# Patient Record
Sex: Female | Born: 1985 | Race: Black or African American | Hispanic: No | Marital: Single | Smoking: Current every day smoker
Health system: Southern US, Community
[De-identification: ages and names within clinical notes are randomized; demographics above are authoritative.]

## PROBLEM LIST (undated history)

## (undated) HISTORY — PX: TONSILLECTOMY: SUR1361

## (undated) HISTORY — PX: TUBAL LIGATION: SHX77

---
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
## 2007-01-03 ENCOUNTER — Emergency Department: Payer: Self-pay | Admitting: Internal Medicine

## 2007-01-10 ENCOUNTER — Emergency Department: Payer: Self-pay | Admitting: Emergency Medicine

## 2007-01-21 ENCOUNTER — Emergency Department: Payer: Self-pay | Admitting: Emergency Medicine

## 2007-01-24 ENCOUNTER — Emergency Department: Payer: Self-pay | Admitting: Unknown Physician Specialty

## 2007-04-05 ENCOUNTER — Emergency Department: Payer: Self-pay | Admitting: Unknown Physician Specialty

## 2007-06-07 ENCOUNTER — Emergency Department: Payer: Self-pay | Admitting: Internal Medicine

## 2007-06-13 ENCOUNTER — Emergency Department: Payer: Self-pay | Admitting: Emergency Medicine

## 2007-07-08 ENCOUNTER — Other Ambulatory Visit: Payer: Self-pay

## 2007-07-08 ENCOUNTER — Emergency Department: Payer: Self-pay | Admitting: Unknown Physician Specialty

## 2007-08-04 ENCOUNTER — Emergency Department: Payer: Self-pay | Admitting: Emergency Medicine

## 2007-08-17 ENCOUNTER — Inpatient Hospital Stay: Payer: Self-pay | Admitting: Psychiatry

## 2007-08-17 ENCOUNTER — Other Ambulatory Visit: Payer: Self-pay

## 2007-09-26 ENCOUNTER — Emergency Department: Payer: Self-pay | Admitting: Internal Medicine

## 2007-10-07 ENCOUNTER — Emergency Department: Payer: Self-pay | Admitting: Emergency Medicine

## 2007-10-19 ENCOUNTER — Emergency Department: Payer: Self-pay | Admitting: Emergency Medicine

## 2007-11-18 ENCOUNTER — Emergency Department: Payer: Self-pay | Admitting: Emergency Medicine

## 2007-11-20 ENCOUNTER — Emergency Department: Payer: Self-pay | Admitting: Emergency Medicine

## 2008-05-10 ENCOUNTER — Emergency Department: Payer: Self-pay | Admitting: Emergency Medicine

## 2008-08-17 ENCOUNTER — Emergency Department: Payer: Self-pay | Admitting: Emergency Medicine

## 2008-09-20 ENCOUNTER — Emergency Department: Payer: Self-pay | Admitting: Emergency Medicine

## 2008-10-06 ENCOUNTER — Emergency Department: Payer: Self-pay | Admitting: Unknown Physician Specialty

## 2008-12-09 ENCOUNTER — Emergency Department: Payer: Self-pay | Admitting: Unknown Physician Specialty

## 2009-05-11 ENCOUNTER — Emergency Department: Payer: Self-pay | Admitting: Emergency Medicine

## 2009-06-18 ENCOUNTER — Emergency Department: Payer: Self-pay | Admitting: Emergency Medicine

## 2009-08-08 ENCOUNTER — Emergency Department: Payer: Self-pay | Admitting: Emergency Medicine

## 2010-07-21 ENCOUNTER — Emergency Department: Payer: Self-pay | Admitting: Emergency Medicine

## 2010-09-17 ENCOUNTER — Emergency Department: Payer: Self-pay | Admitting: Emergency Medicine

## 2010-10-01 ENCOUNTER — Emergency Department: Payer: Self-pay | Admitting: Emergency Medicine

## 2010-10-08 ENCOUNTER — Emergency Department: Payer: Self-pay | Admitting: Emergency Medicine

## 2010-11-28 ENCOUNTER — Emergency Department: Payer: Self-pay | Admitting: Emergency Medicine

## 2011-02-05 ENCOUNTER — Emergency Department: Payer: Self-pay | Admitting: *Deleted

## 2011-02-28 ENCOUNTER — Emergency Department: Payer: Self-pay | Admitting: Emergency Medicine

## 2011-03-01 LAB — URINALYSIS, COMPLETE
Bilirubin,UR: NEGATIVE
Blood: NEGATIVE
Glucose,UR: NEGATIVE mg/dL (ref 0–75)
Ketone: NEGATIVE
Specific Gravity: 1.033 (ref 1.003–1.030)
Squamous Epithelial: 9

## 2011-03-01 LAB — COMPREHENSIVE METABOLIC PANEL
BUN: 11 mg/dL (ref 7–18)
Bilirubin,Total: 0.4 mg/dL (ref 0.2–1.0)
Calcium, Total: 8.7 mg/dL (ref 8.5–10.1)
Chloride: 110 mmol/L — ABNORMAL HIGH (ref 98–107)
Co2: 22 mmol/L (ref 21–32)
Creatinine: 0.51 mg/dL — ABNORMAL LOW (ref 0.60–1.30)
EGFR (African American): >60
EGFR (Non-African Amer.): >60
Glucose: 104 mg/dL — ABNORMAL HIGH (ref 65–99)
Potassium: 3.5 mmol/L (ref 3.5–5.1)
SGPT (ALT): 19 U/L
Sodium: 145 mmol/L (ref 136–145)

## 2011-03-01 LAB — CBC
HGB: 13.5 g/dL (ref 12.0–16.0)
MCH: 32.2 pg (ref 26.0–34.0)
MCV: 95 fL (ref 80–100)
Platelet: 247 10*3/uL (ref 150–440)
RBC: 4.2 10*6/uL (ref 3.80–5.20)
RDW: 13.5 % (ref 11.5–14.5)

## 2011-03-01 LAB — DRUG SCREEN, URINE
Amphetamines, Ur Screen: NEGATIVE (ref ?–1000)
Barbiturates, Ur Screen: NEGATIVE (ref ?–200)
Benzodiazepine, Ur Scrn: NEGATIVE (ref ?–200)
Cannabinoid 50 Ng, Ur ~~LOC~~: POSITIVE (ref ?–50)
MDMA (Ecstasy)Ur Screen: NEGATIVE (ref ?–500)
Methadone, Ur Screen: NEGATIVE (ref ?–300)
Tricyclic, Ur Screen: NEGATIVE (ref ?–1000)

## 2011-03-01 LAB — ACETAMINOPHEN LEVEL
Acetaminophen: 49 ug/mL — ABNORMAL HIGH
Acetaminophen: 25 ug/mL

## 2011-03-01 LAB — SALICYLATE LEVEL
Salicylates, Serum: 1.8 mg/dL
Salicylates, Serum: <1.7 mg/dL

## 2011-03-01 LAB — TSH: Thyroid Stimulating Horm: 2.36 u[IU]/mL

## 2011-03-01 LAB — PREGNANCY, URINE: Pregnancy Test, Urine: NEGATIVE m[IU]/mL

## 2011-03-01 LAB — ETHANOL: Ethanol: <3 mg/dL

## 2011-04-13 ENCOUNTER — Emergency Department: Payer: Self-pay | Admitting: *Deleted

## 2011-06-17 ENCOUNTER — Emergency Department: Payer: Self-pay | Admitting: Emergency Medicine

## 2011-06-17 LAB — URINALYSIS, COMPLETE
Bilirubin,UR: NEGATIVE
Blood: NEGATIVE
Ketone: NEGATIVE
Leukocyte Esterase: NEGATIVE
Nitrite: NEGATIVE
Ph: 7 (ref 4.5–8.0)
Protein: NEGATIVE
RBC,UR: 1 /HPF (ref 0–5)
Specific Gravity: 1.021 (ref 1.003–1.030)
Squamous Epithelial: 3

## 2011-06-17 LAB — PREGNANCY, URINE: Pregnancy Test, Urine: NEGATIVE m[IU]/mL

## 2011-08-01 ENCOUNTER — Emergency Department: Payer: Self-pay | Admitting: Emergency Medicine

## 2011-08-17 ENCOUNTER — Emergency Department: Payer: Self-pay | Admitting: Unknown Physician Specialty

## 2011-08-17 LAB — ETHANOL: Ethanol %: <0.003 % (ref 0.000–0.080)

## 2011-08-17 LAB — COMPREHENSIVE METABOLIC PANEL
Anion Gap: 11 (ref 7–16)
Calcium, Total: 9.2 mg/dL (ref 8.5–10.1)
Chloride: 110 mmol/L — ABNORMAL HIGH (ref 98–107)
Co2: 20 mmol/L — ABNORMAL LOW (ref 21–32)
EGFR (African American): >60
EGFR (Non-African Amer.): >60
Osmolality: 279 (ref 275–301)
Potassium: 3.5 mmol/L (ref 3.5–5.1)
SGOT(AST): 25 U/L (ref 15–37)
Sodium: 141 mmol/L (ref 136–145)
Total Protein: 7.7 g/dL (ref 6.4–8.2)

## 2011-08-17 LAB — DRUG SCREEN, URINE
Barbiturates, Ur Screen: NEGATIVE (ref ?–200)
Benzodiazepine, Ur Scrn: POSITIVE (ref ?–200)
Cannabinoid 50 Ng, Ur ~~LOC~~: POSITIVE (ref ?–50)
MDMA (Ecstasy)Ur Screen: NEGATIVE (ref ?–500)
Phencyclidine (PCP) Ur S: NEGATIVE (ref ?–25)

## 2011-08-17 LAB — CBC
HCT: 42.3 % (ref 35.0–47.0)
MCH: 32.4 pg (ref 26.0–34.0)
MCHC: 33.7 g/dL (ref 32.0–36.0)
MCV: 96 fL (ref 80–100)
RDW: 14.1 % (ref 11.5–14.5)

## 2011-08-17 LAB — SALICYLATE LEVEL: Salicylates, Serum: <1.7 mg/dL

## 2011-08-17 LAB — ACETAMINOPHEN LEVEL: Acetaminophen: <2 ug/mL

## 2011-08-17 LAB — PREGNANCY, URINE: Pregnancy Test, Urine: NEGATIVE m[IU]/mL

## 2011-12-21 ENCOUNTER — Emergency Department: Payer: Self-pay | Admitting: Emergency Medicine

## 2011-12-21 LAB — CBC
HCT: 37.6 % (ref 35.0–47.0)
Platelet: 263 10*3/uL (ref 150–440)
RDW: 12.9 % (ref 11.5–14.5)
WBC: 7.4 10*3/uL (ref 3.6–11.0)

## 2011-12-21 LAB — URINALYSIS, COMPLETE
Bilirubin,UR: NEGATIVE
Ketone: NEGATIVE
Ph: 7 (ref 4.5–8.0)
Specific Gravity: 1.021 (ref 1.003–1.030)
Squamous Epithelial: 20
WBC UR: 3 /HPF (ref 0–5)

## 2011-12-21 LAB — HCG, QUANTITATIVE, PREGNANCY: Beta Hcg, Quant.: 90254 m[IU]/mL — ABNORMAL HIGH

## 2011-12-21 LAB — WET PREP, GENITAL

## 2012-01-17 ENCOUNTER — Encounter: Payer: Self-pay | Admitting: Maternal and Fetal Medicine

## 2012-02-15 ENCOUNTER — Emergency Department: Payer: Self-pay | Admitting: Emergency Medicine

## 2012-02-16 LAB — CBC
HCT: 38.1 % (ref 35.0–47.0)
HGB: 13 g/dL (ref 12.0–16.0)
MCH: 31.5 pg (ref 26.0–34.0)
MCHC: 34.2 g/dL (ref 32.0–36.0)
RBC: 4.14 10*6/uL (ref 3.80–5.20)
WBC: 10.5 10*3/uL (ref 3.6–11.0)

## 2012-02-16 LAB — COMPREHENSIVE METABOLIC PANEL
Anion Gap: 12 (ref 7–16)
BUN: 8 mg/dL (ref 7–18)
Calcium, Total: 9.1 mg/dL (ref 8.5–10.1)
Chloride: 105 mmol/L (ref 98–107)
Co2: 21 mmol/L (ref 21–32)
Creatinine: 0.29 mg/dL — ABNORMAL LOW (ref 0.60–1.30)
EGFR (African American): >60
EGFR (Non-African Amer.): >60
Potassium: 4.2 mmol/L (ref 3.5–5.1)
SGOT(AST): 30 U/L (ref 15–37)
SGPT (ALT): 15 U/L (ref 12–78)
Sodium: 138 mmol/L (ref 136–145)

## 2012-02-16 LAB — URINALYSIS, COMPLETE
Bilirubin,UR: NEGATIVE
Blood: NEGATIVE
Glucose,UR: NEGATIVE mg/dL (ref 0–75)
Ketone: NEGATIVE
Leukocyte Esterase: NEGATIVE
Nitrite: NEGATIVE
Ph: 7 (ref 4.5–8.0)
Protein: NEGATIVE
RBC,UR: <1 /HPF (ref 0–5)
WBC UR: <1 /HPF (ref 0–5)

## 2012-02-16 LAB — HCG, QUANTITATIVE, PREGNANCY: Beta Hcg, Quant.: 40478 m[IU]/mL — ABNORMAL HIGH

## 2012-03-09 ENCOUNTER — Encounter: Payer: Self-pay | Admitting: Physician Assistant

## 2012-03-09 ENCOUNTER — Encounter: Payer: Self-pay | Admitting: Obstetrics and Gynecology

## 2012-04-30 ENCOUNTER — Observation Stay: Payer: Self-pay

## 2012-05-01 LAB — URINALYSIS, COMPLETE
Glucose,UR: NEGATIVE mg/dL (ref 0–75)
Ph: 7 (ref 4.5–8.0)
Protein: NEGATIVE
Specific Gravity: 1.016 (ref 1.003–1.030)
WBC UR: 1 /HPF (ref 0–5)

## 2012-06-28 ENCOUNTER — Emergency Department: Payer: Self-pay | Admitting: Emergency Medicine

## 2012-06-28 LAB — URINALYSIS, COMPLETE
Bilirubin,UR: NEGATIVE
Blood: NEGATIVE
Glucose,UR: NEGATIVE mg/dL (ref 0–75)
Ketone: NEGATIVE
Ph: 7 (ref 4.5–8.0)
Protein: NEGATIVE
RBC,UR: 11 /HPF (ref 0–5)
Specific Gravity: 1.01 (ref 1.003–1.030)
WBC UR: 11 /HPF (ref 0–5)

## 2012-06-29 LAB — GC/CHLAMYDIA PROBE AMP

## 2012-07-10 ENCOUNTER — Observation Stay: Payer: Self-pay

## 2012-07-24 ENCOUNTER — Observation Stay: Payer: Self-pay | Admitting: Obstetrics and Gynecology

## 2012-07-24 LAB — URINALYSIS, COMPLETE
Blood: NEGATIVE
Glucose,UR: NEGATIVE mg/dL (ref 0–75)
Nitrite: NEGATIVE
Ph: 7 (ref 4.5–8.0)
RBC,UR: 2 /HPF (ref 0–5)
Squamous Epithelial: 11
WBC UR: 17 /HPF (ref 0–5)

## 2012-07-24 LAB — DRUG SCREEN, URINE
Barbiturates, Ur Screen: NEGATIVE (ref ?–200)
Cannabinoid 50 Ng, Ur ~~LOC~~: NEGATIVE (ref ?–50)
MDMA (Ecstasy)Ur Screen: NEGATIVE (ref ?–500)
Methadone, Ur Screen: NEGATIVE (ref ?–300)
Phencyclidine (PCP) Ur S: NEGATIVE (ref ?–25)
Tricyclic, Ur Screen: NEGATIVE (ref ?–1000)

## 2012-08-03 ENCOUNTER — Observation Stay: Payer: Self-pay | Admitting: Advanced Practice Midwife

## 2012-08-04 ENCOUNTER — Inpatient Hospital Stay: Payer: Self-pay | Admitting: Obstetrics and Gynecology

## 2012-08-04 LAB — CBC WITH DIFFERENTIAL/PLATELET
Eosinophil #: 0.1 10*3/uL (ref 0.0–0.7)
Eosinophil %: 1.3 %
Lymphocyte %: 27.2 %
MCV: 93 fL (ref 80–100)
Monocyte #: 1.1 x10 3/mm — ABNORMAL HIGH (ref 0.2–0.9)
WBC: 9.2 10*3/uL (ref 3.6–11.0)

## 2012-08-04 LAB — DRUG SCREEN, URINE
Amphetamines, Ur Screen: NEGATIVE (ref ?–1000)
Barbiturates, Ur Screen: NEGATIVE (ref ?–200)
Cannabinoid 50 Ng, Ur ~~LOC~~: NEGATIVE (ref ?–50)
MDMA (Ecstasy)Ur Screen: NEGATIVE (ref ?–500)
Opiate, Ur Screen: NEGATIVE (ref ?–300)
Phencyclidine (PCP) Ur S: NEGATIVE (ref ?–25)

## 2012-08-05 LAB — HEMATOCRIT: HCT: 32.7 % — ABNORMAL LOW (ref 35.0–47.0)

## 2013-08-25 IMAGING — US US OB < 14 WEEKS - US OB TV
2 series · 14 of 26 positions shown · non-contrast
Comparison: none

REASON FOR EXAM: pelvic pain, +hcg
COMMENTS:

[Series 1: us ob < 14 weeks - us ob tv · 0.26mm/px · 13 of 24 slices shown (1 of 2)]
[im 1/24]
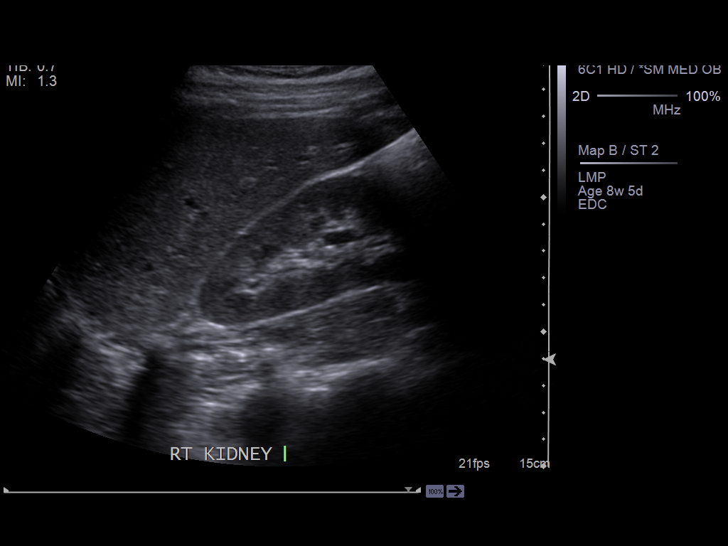
[im 3/24]
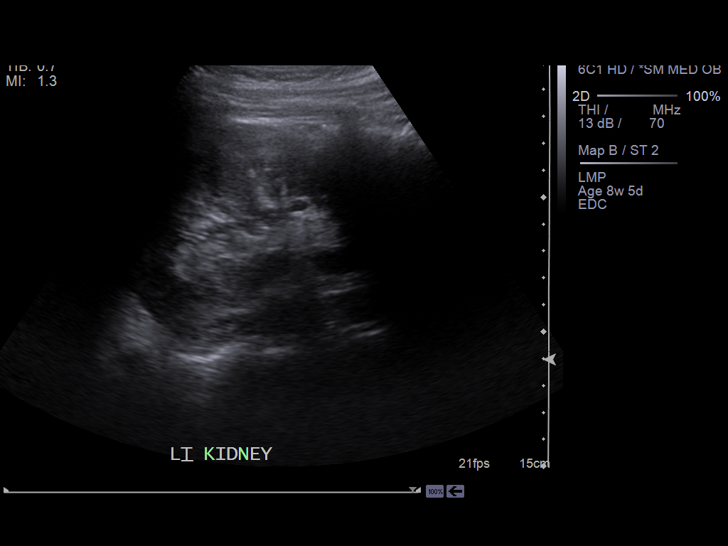
[im 5/24]
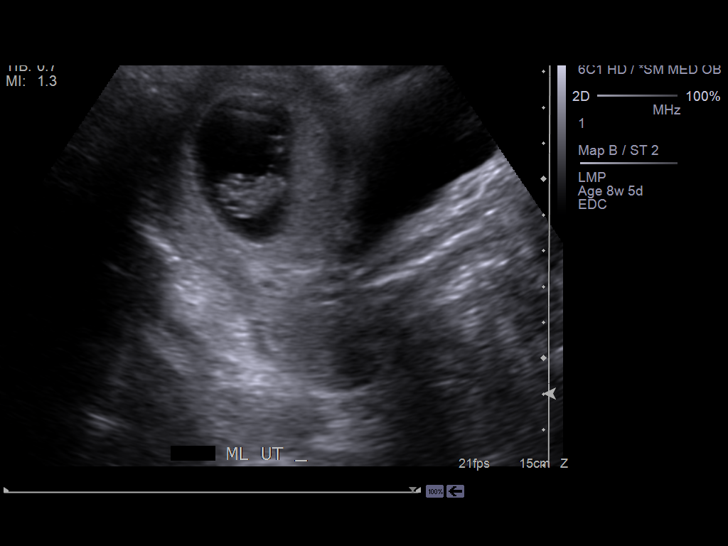
[im 7/24]
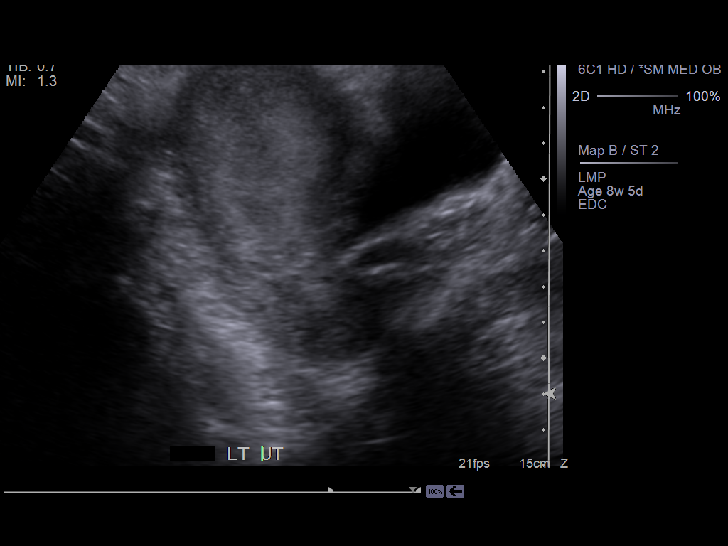
[im 9/24]
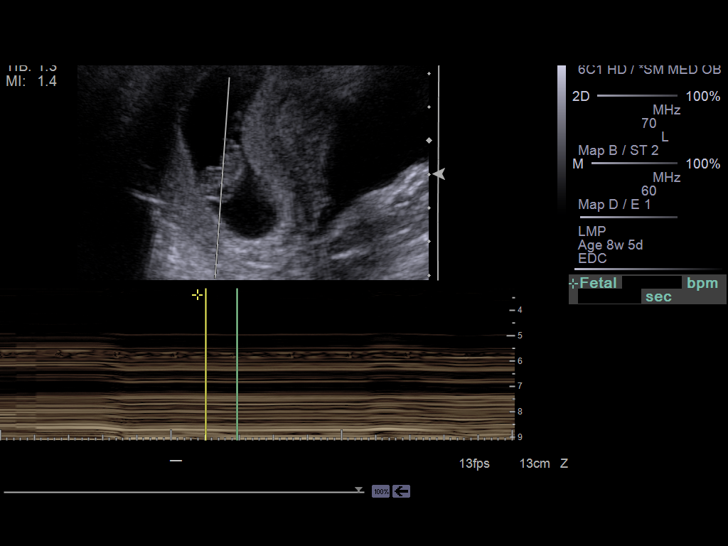
[im 11/24]
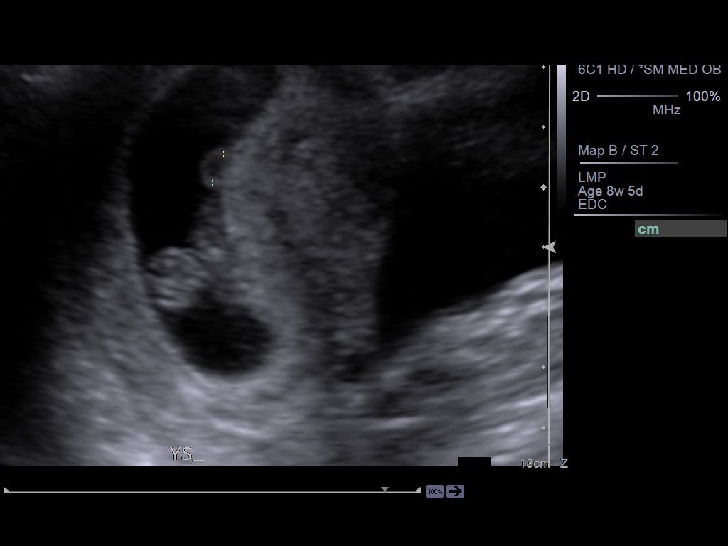
[im 13/24]
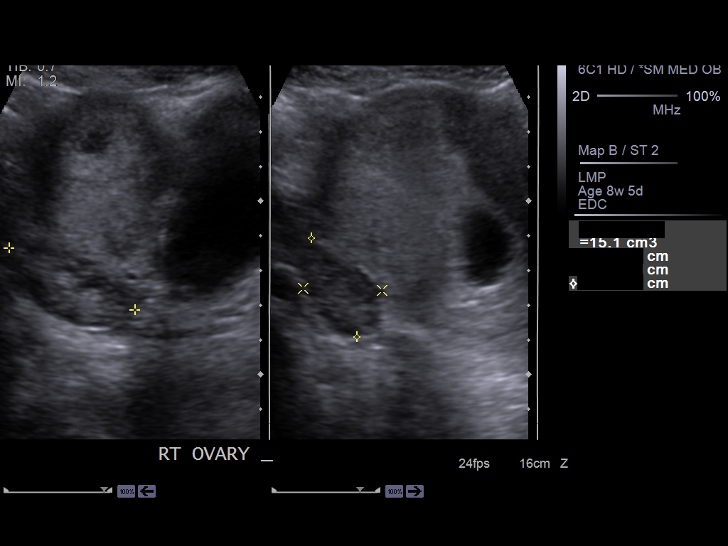
[im 14/24]
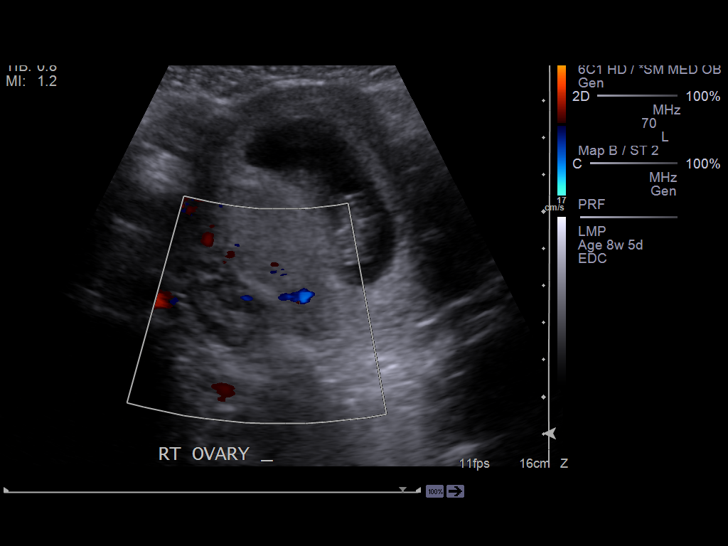
[im 16/24]
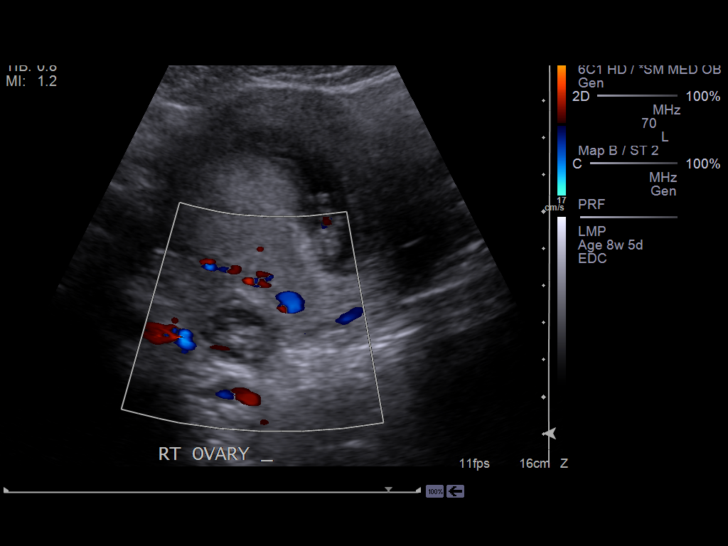
[im 18/24]
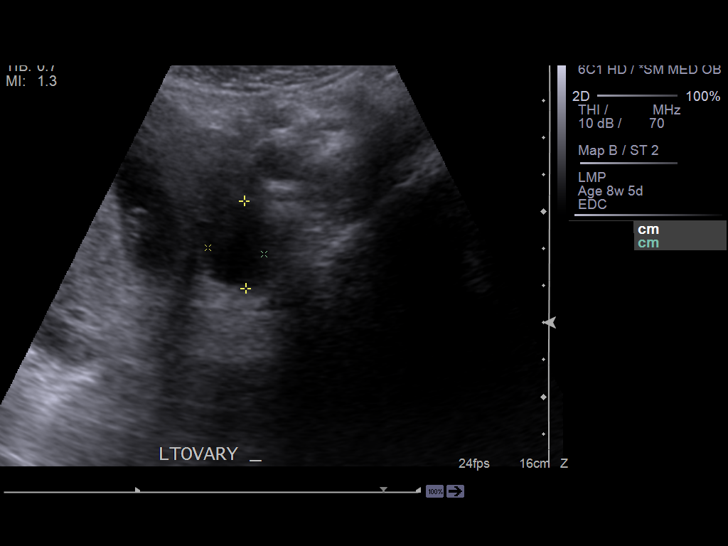
[im 20/24]
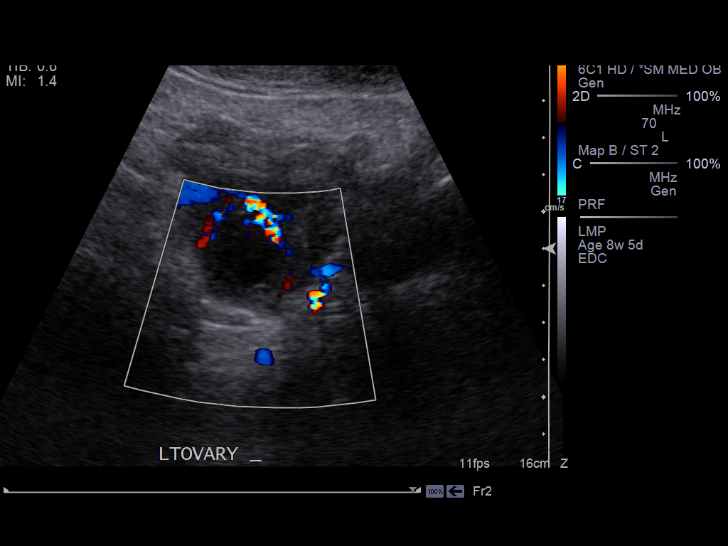
[im 22/24]
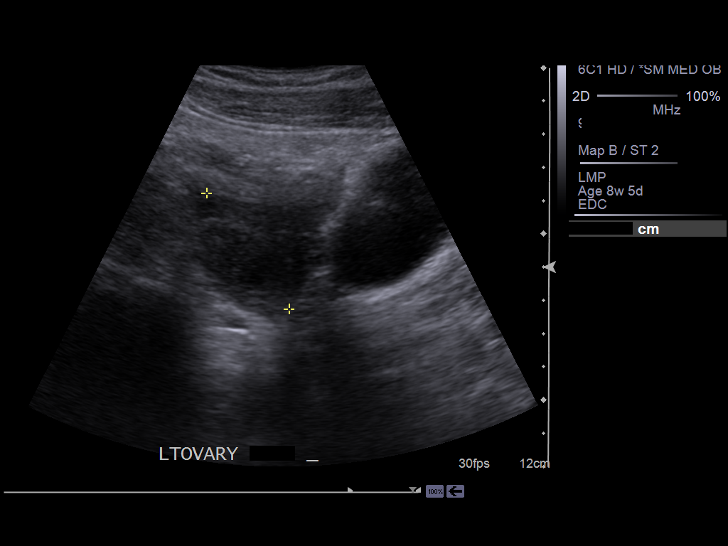
[im 24/24]
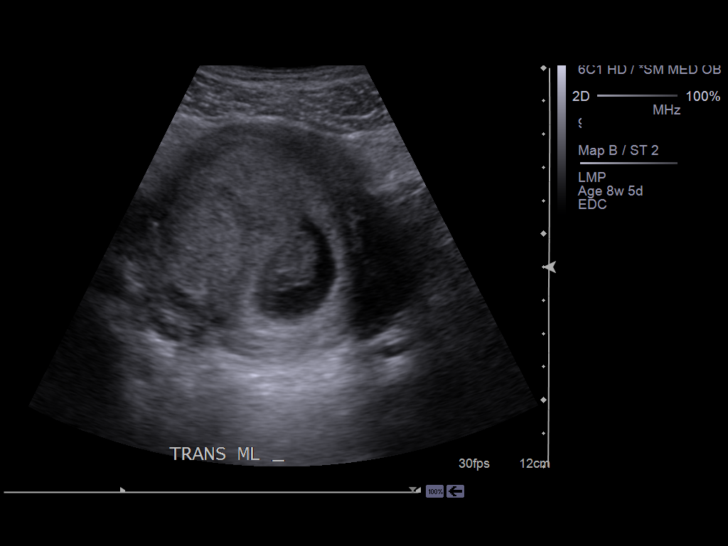

[Series 1001: us ob < 14 weeks - us ob tv · 1 of 2 slices shown (2 of 2)]
[im 2/2]
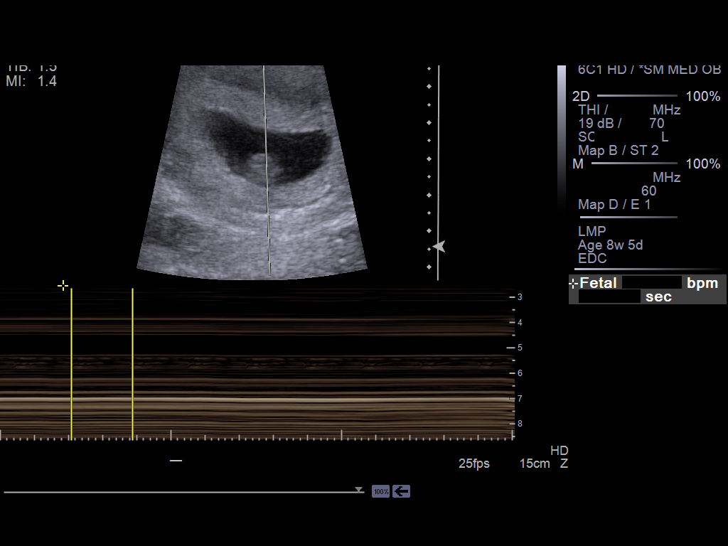

[14 of 26 positions shown; findings below may reference images not displayed]

PROCEDURE:     US  - US OB LESS THAN 14 WEEKS/W TRANS  - December 21, 2011  [DATE]

RESULT:     Transabdominal and transvaginal imaging was performed.

There is a gravid uterus. The crown-rump length of the fetus measures
cm corresponding to an 8 week 5 day gestation. A yolk sac is visible. A
fetal cardiac rate of 168 beats per minute was demonstrated.

The left ovary measures 2.8 x 2.9 x 4.3 cm and contains a cystic structure
measuring 2.4 x 1.5 x 1.5 cm. This cystic structure is not hypervascular.
The right ovary measures 4 x 2.3 x 3.1 cm.
IMPRESSION: There is a viable IUP with estimated gestational age of 8
weeks 5 days + / -0days with an estimated date of confinement 27 July, 2012.
There is also a left ovarian cyst measuring 2.4 cm in greatest dimension.
There are no findings suspicious for an ectopic pregnancy.

[REDACTED]

## 2013-09-21 IMAGING — US US OB NUCHAL TRANSLUCENCY 1ST GEST - [facility] NRPT
1 series · 14 of 28 positions shown · non-contrast
Comparison: none

[Series 1: us ob nuchal translucency 1st gest - [facility] nrpt · 0.16mm/px · 14 of 33 slices shown]
[im 2/33]
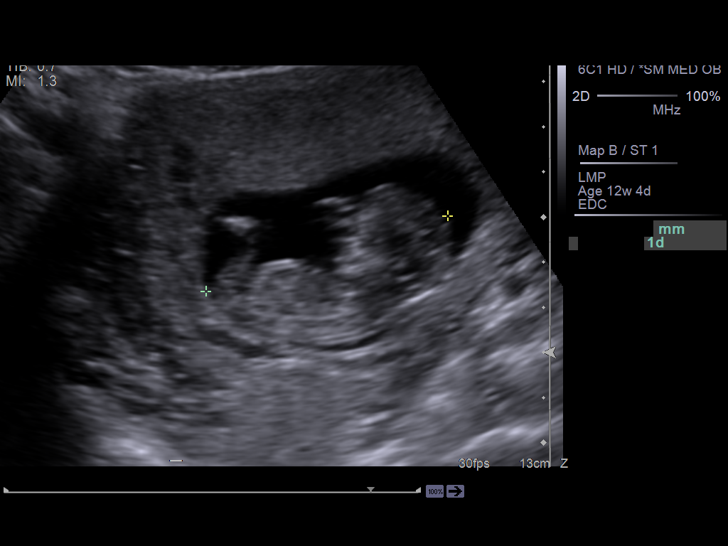
[im 4/33]
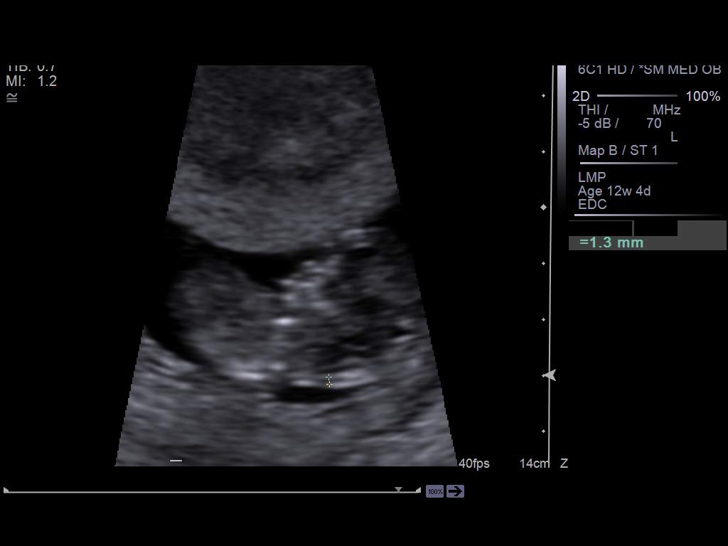
[im 6/33]
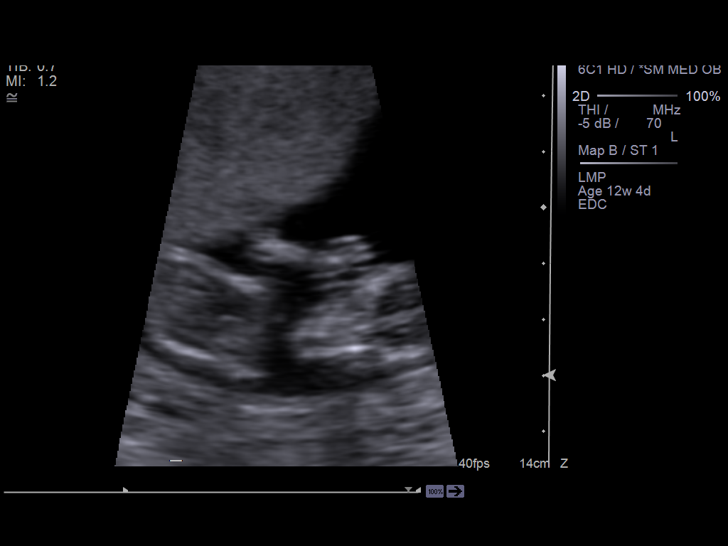
[im 9/33]
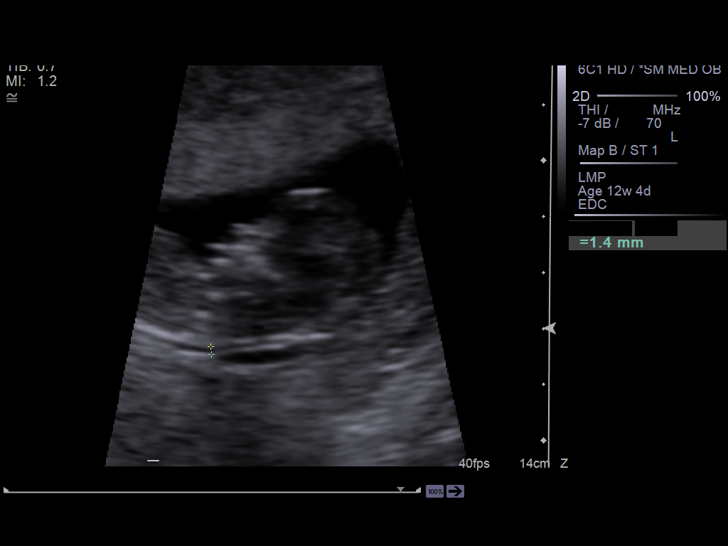
[im 11/33]
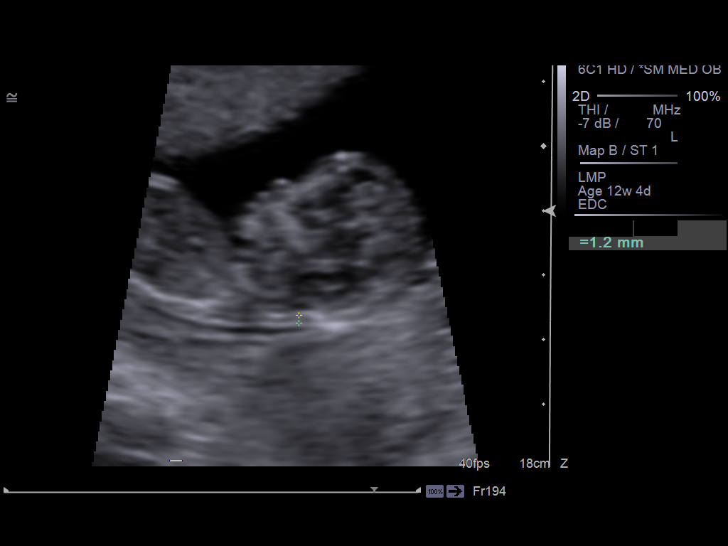
[im 14/33]
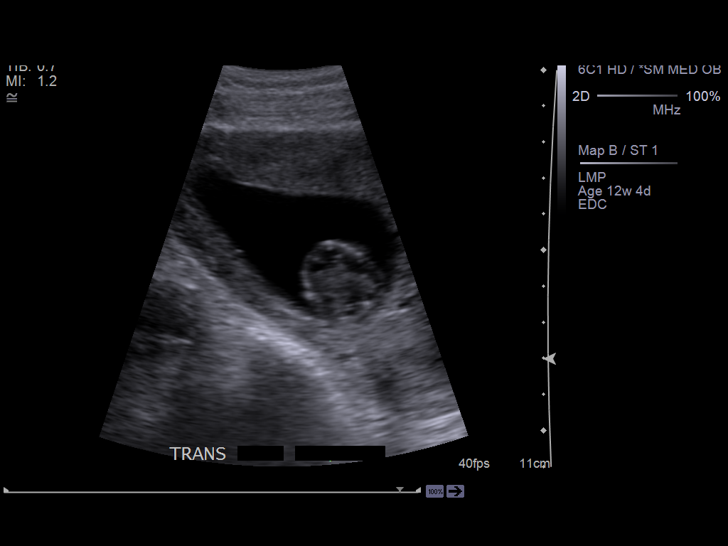
[im 16/33]
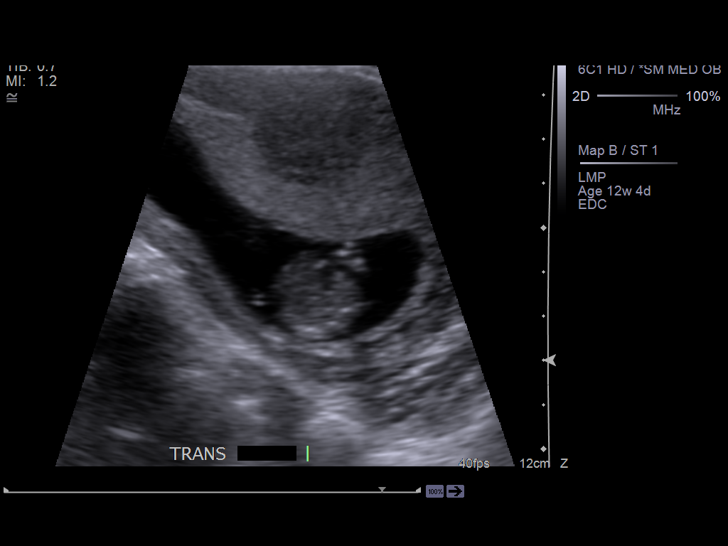
[im 18/33]
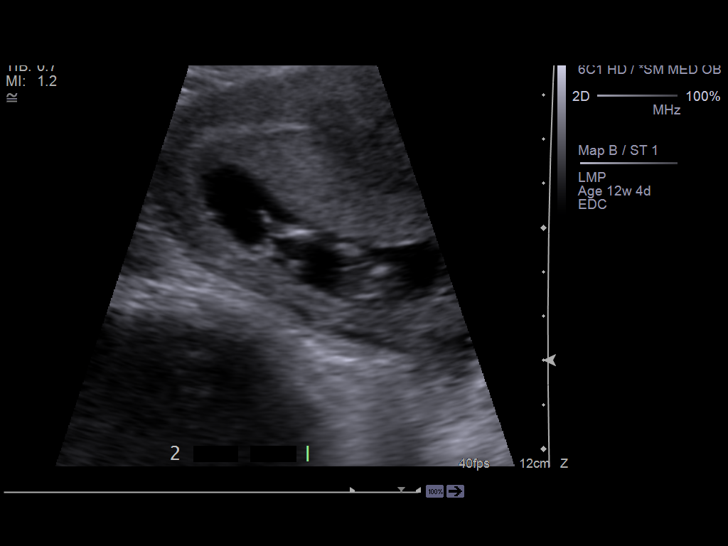
[im 21/33]
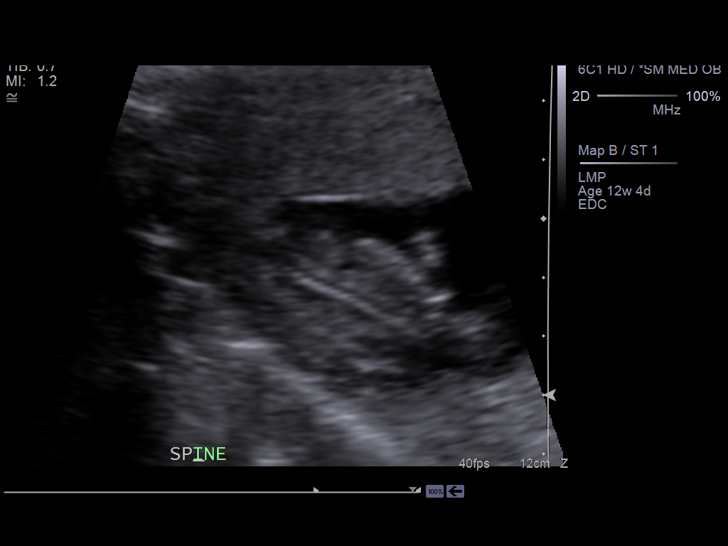
[im 23/33]
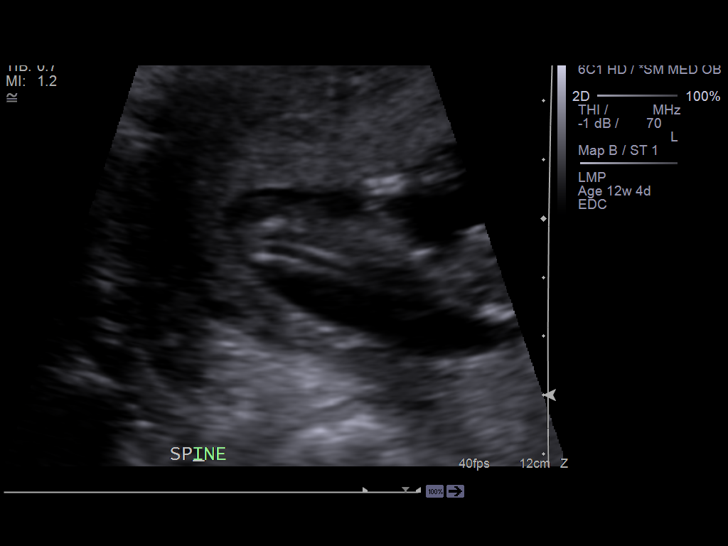
[im 25/33]
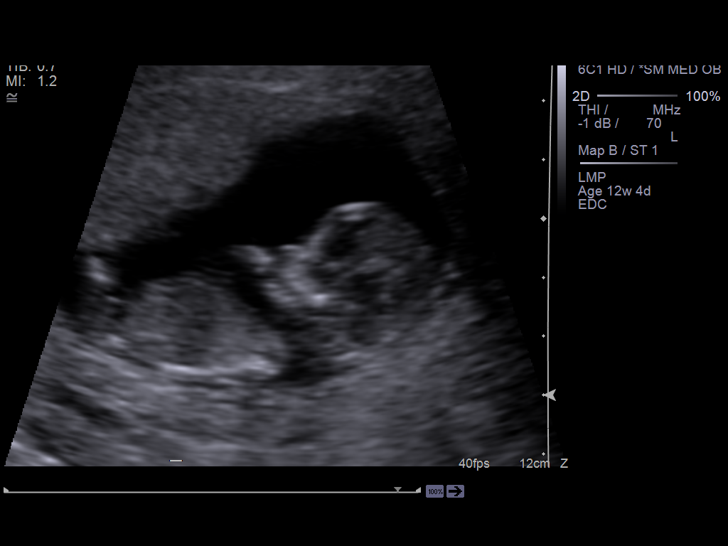
[im 28/33]
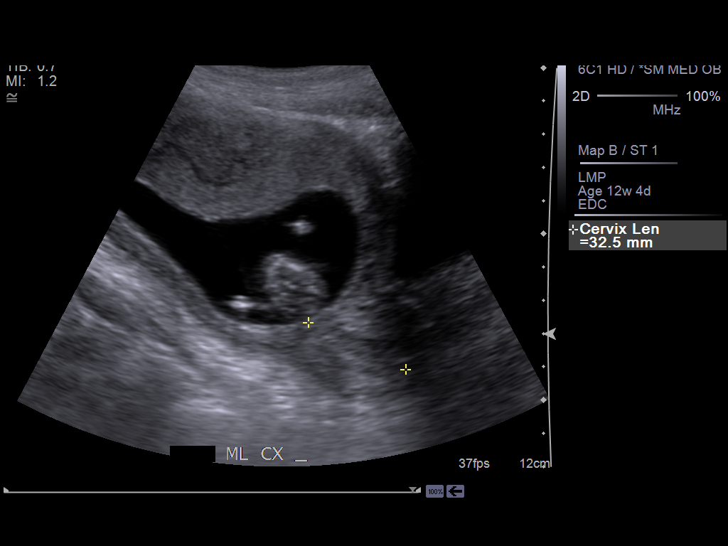
[im 30/33]
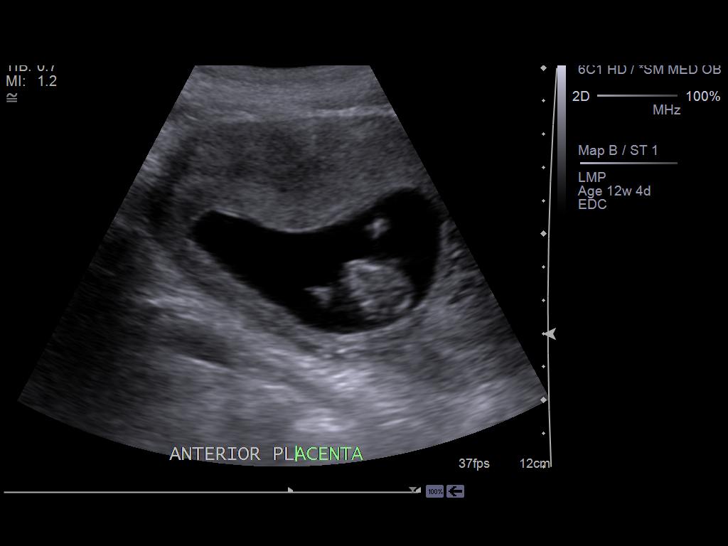
[im 33/33]
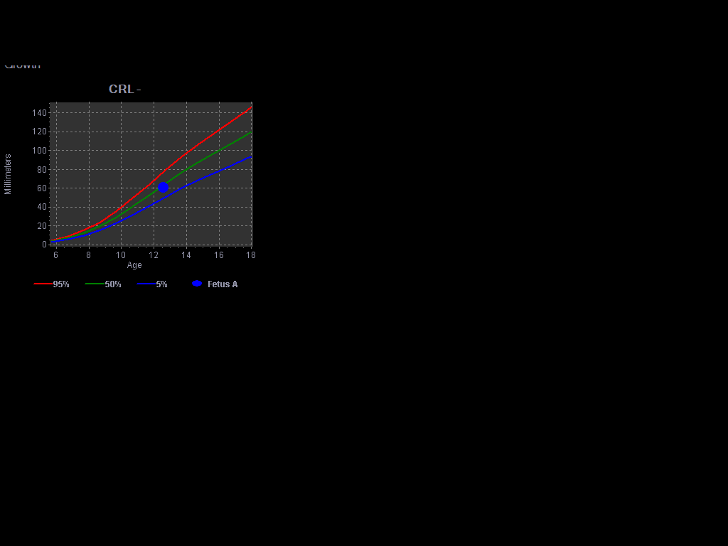

[14 of 28 positions shown; findings below may reference images not displayed]

IMAGES IMPORTED FROM THE [employer] WORKFLOW SYSTEM
NO DICTATION FOR STUDY

## 2013-10-21 IMAGING — US US OB LIMITED
1 series · 14 of 28 positions shown · non-contrast
Comparison: none

REASON FOR EXAM: abd pain eval
COMMENTS:   May transport without cardiac monitor

[Series 1: us ob limited · 0.23mm/px · 14 of 41 slices shown]
[im 2/41]
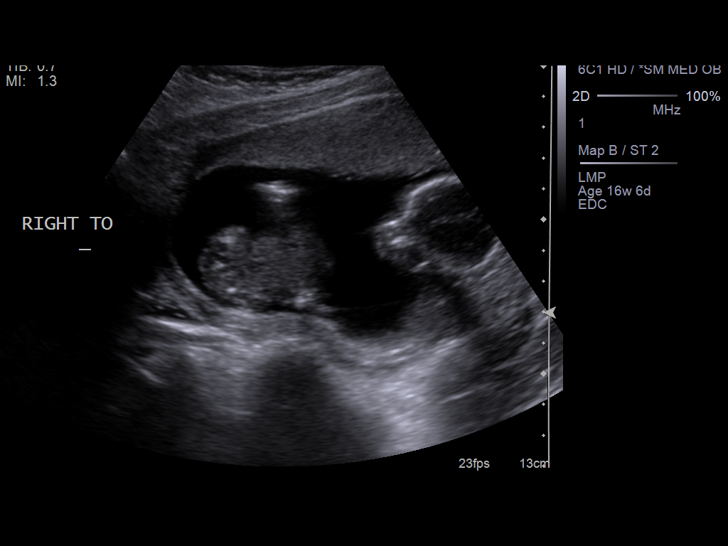
[im 5/41]
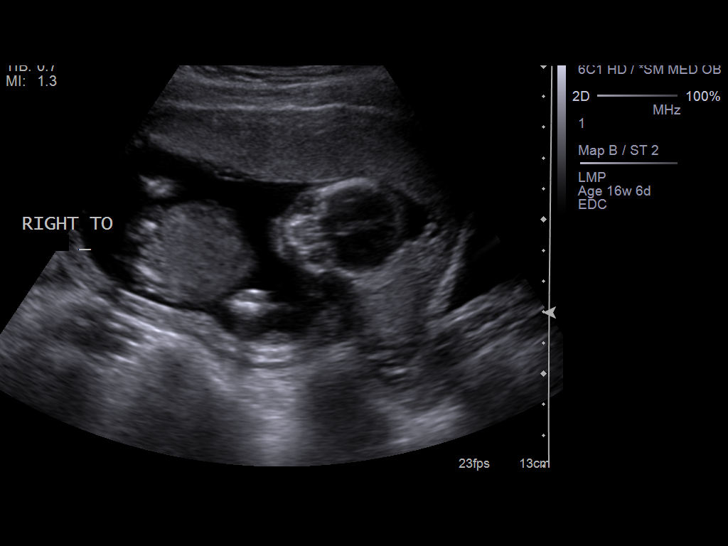
[im 8/41]
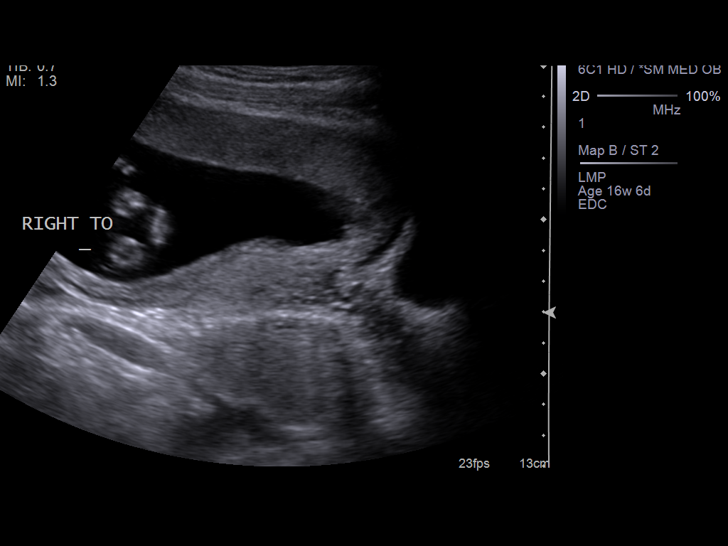
[im 11/41]
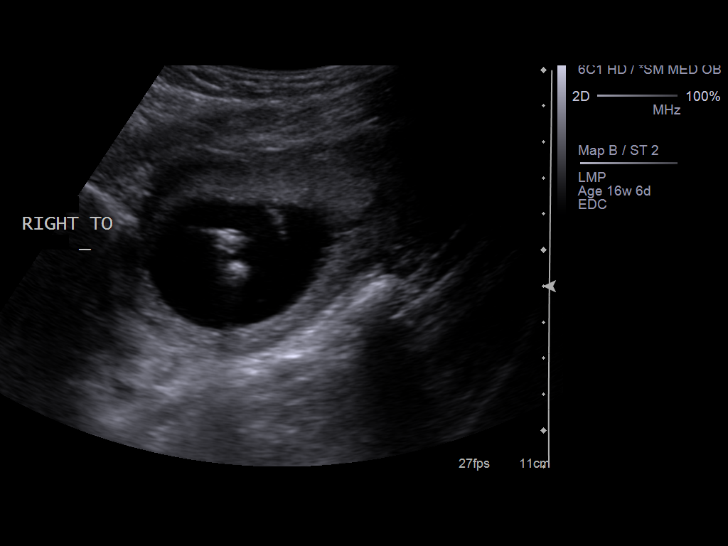
[im 14/41]
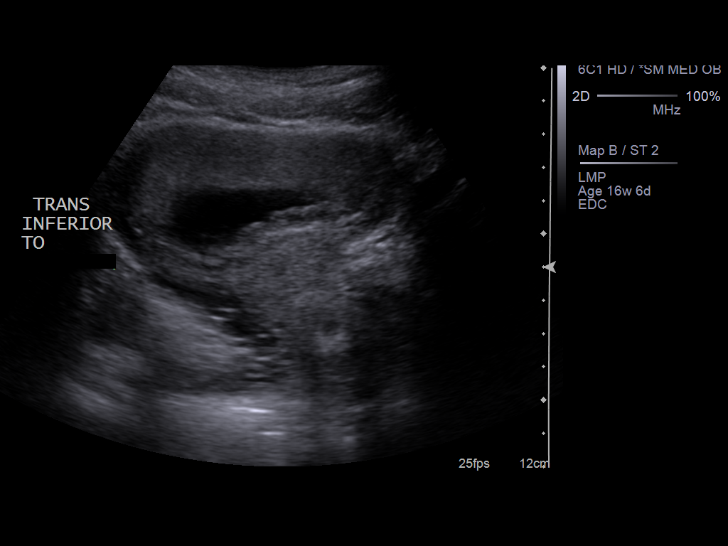
[im 17/41]
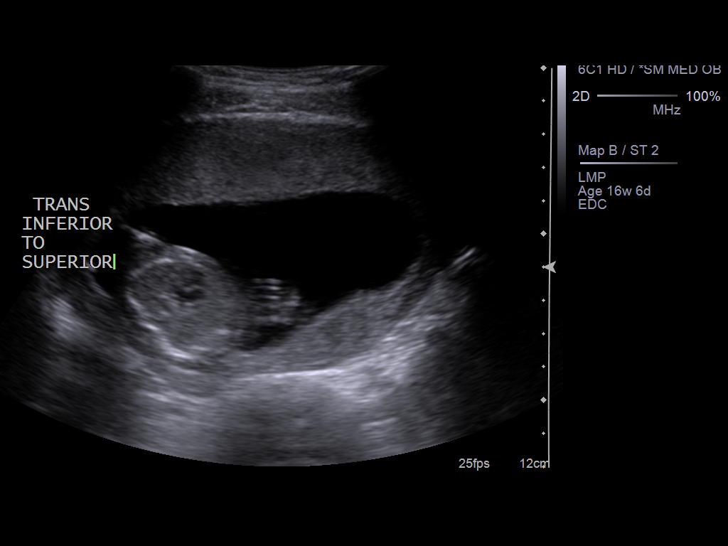
[im 20/41]
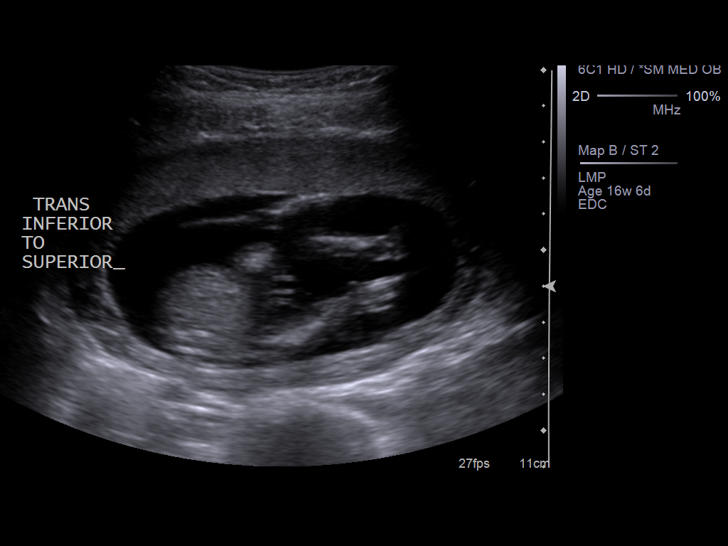
[im 23/41]
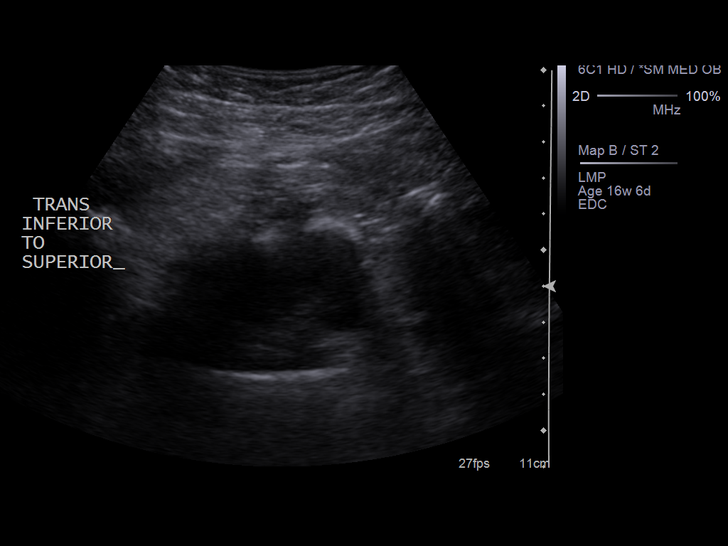
[im 26/41]
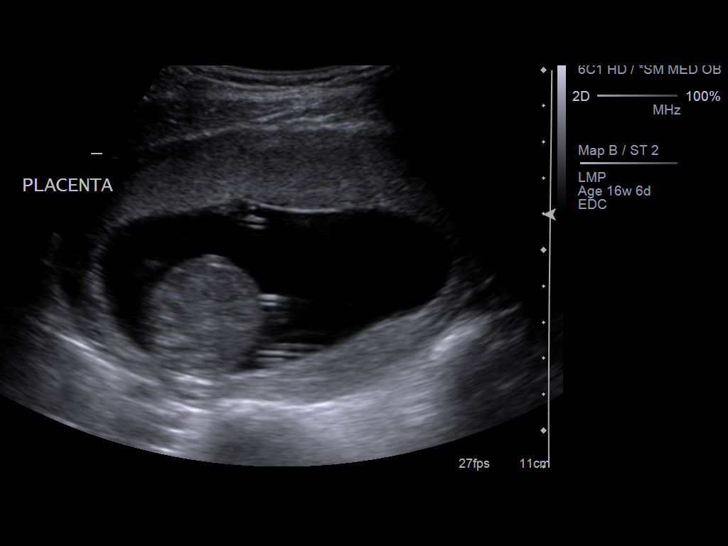
[im 29/41]
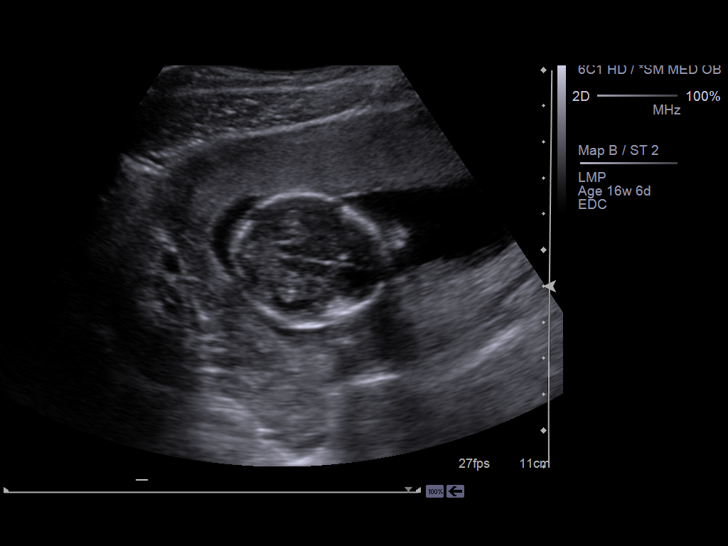
[im 32/41]
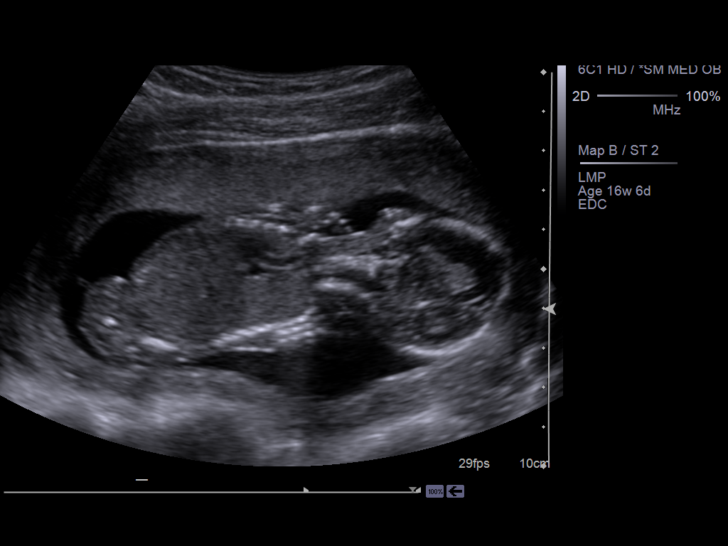
[im 35/41]
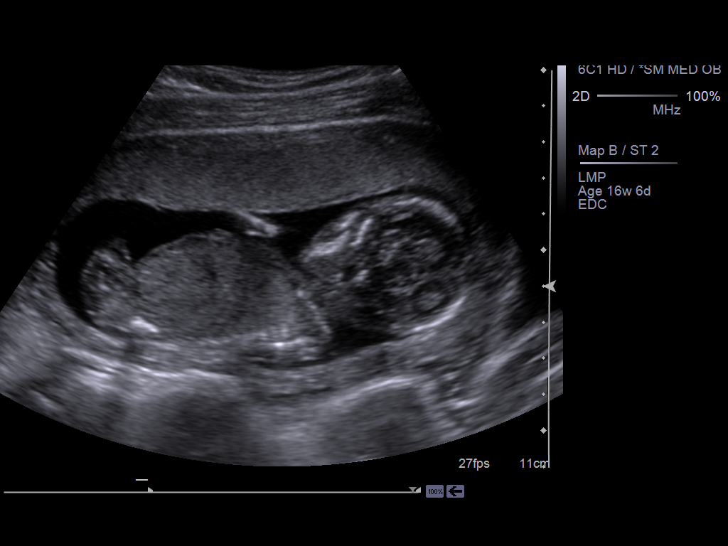
[im 38/41]
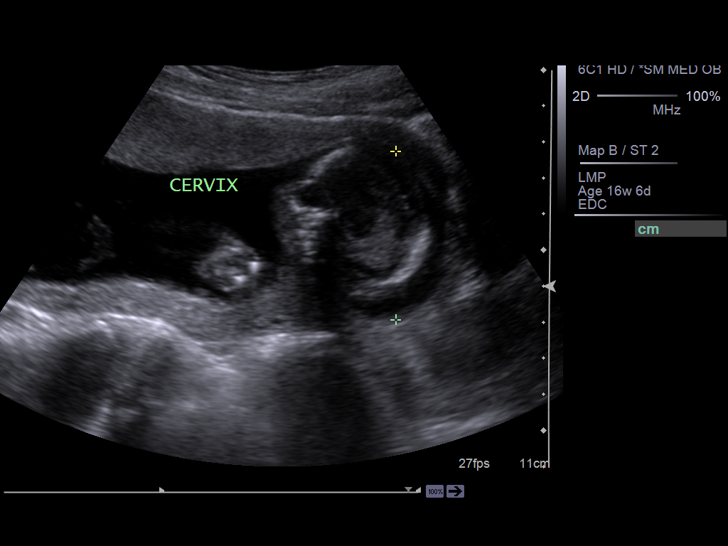
[im 41/41]
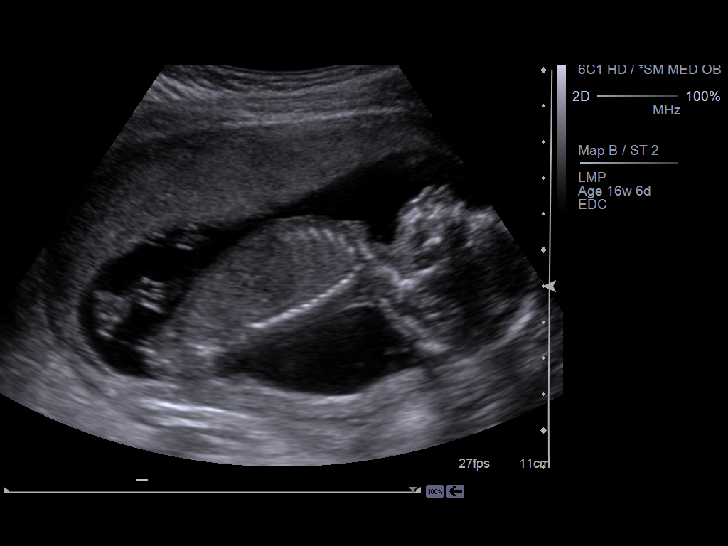

[14 of 28 positions shown; findings below may reference images not displayed]

PROCEDURE:     US  - US LIMITED OB  - February 16, 2012  [DATE]

RESULT:     Findings: A single living intrauterine gestation is observed.
Presentation currently is variable. The cervix is cloaked measuring 3.02 in
length. The placenta is anterior measuring 4.67 cm from the cervical os. The
fetal heart rate was monitored at 141 beats per minute. The AFI is
qualitatively normal.

The estimated gestational age is 16 weeks 6 days by the last menstrual
period of 10/21/2011 with an estimated date of conception of 07/27/2012.
IMPRESSION: See above.

[REDACTED]

## 2013-11-12 IMAGING — US US OB DETAIL+14 WK - NRPT [facility]
1 series · 14 of 28 positions shown · non-contrast
Comparison: none

[Series 1: us ob detail+14 wk - nrpt [facility] · 0.25mm/px · 14 of 101 slices shown]
[im 4/101]
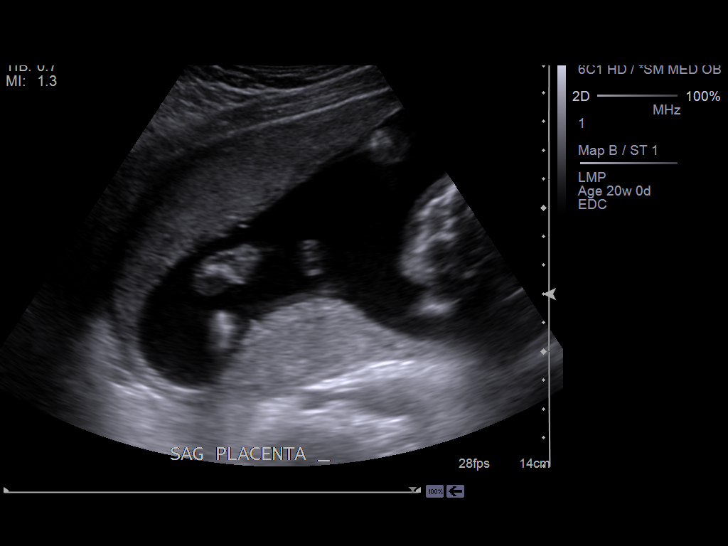
[im 12/101]
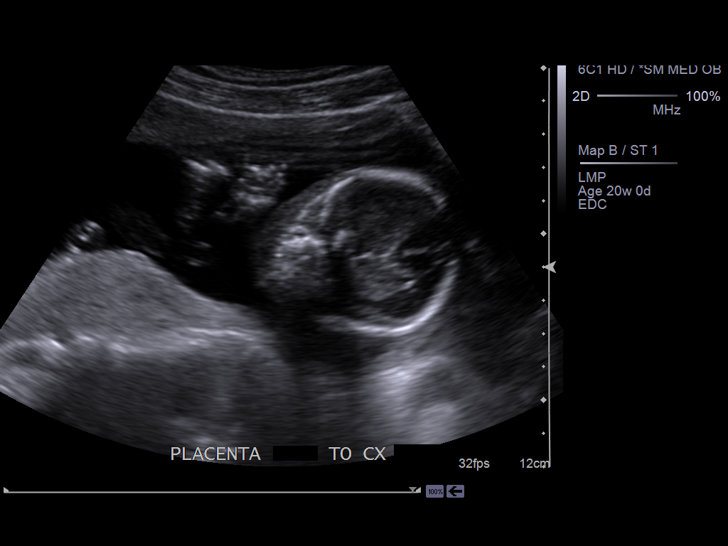
[im 19/101]
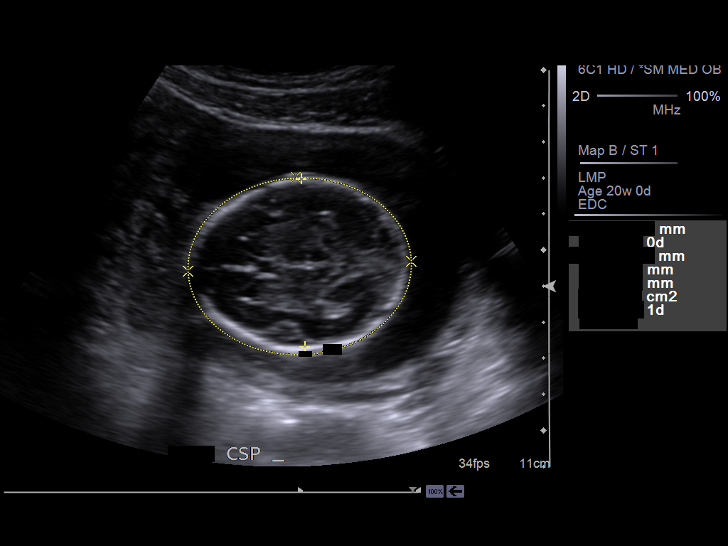
[im 26/101]
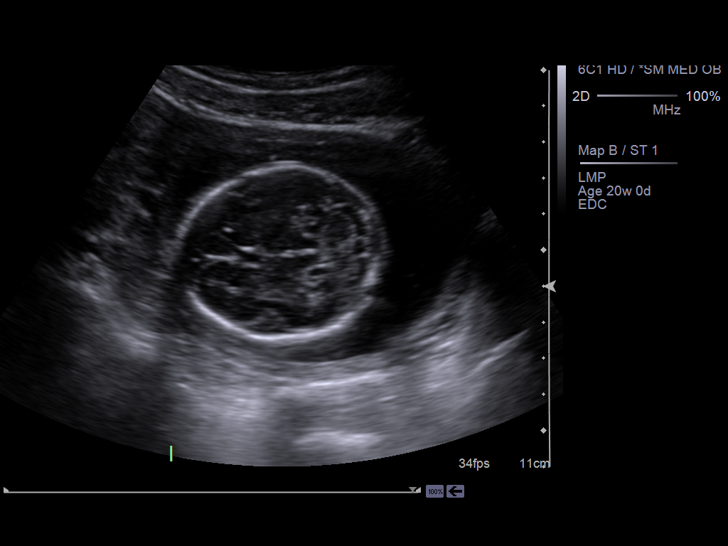
[im 34/101]
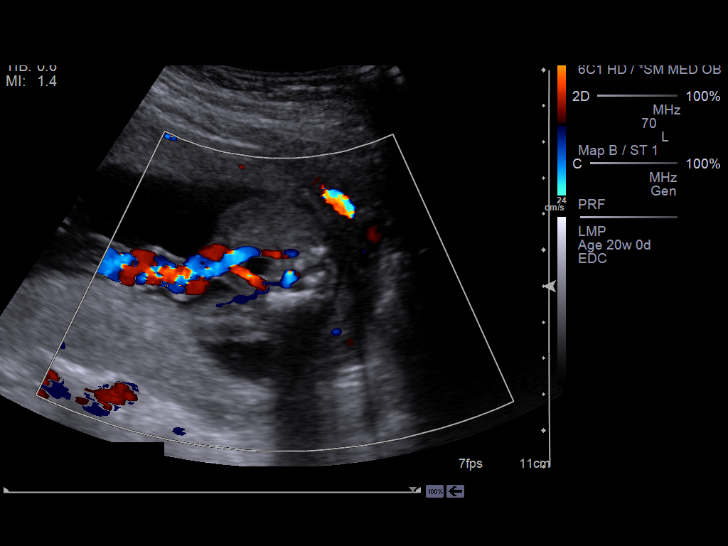
[im 41/101]
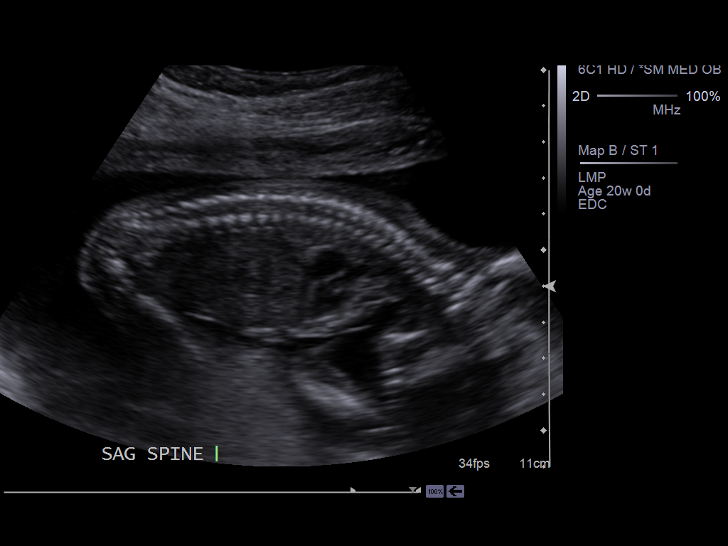
[im 49/101]
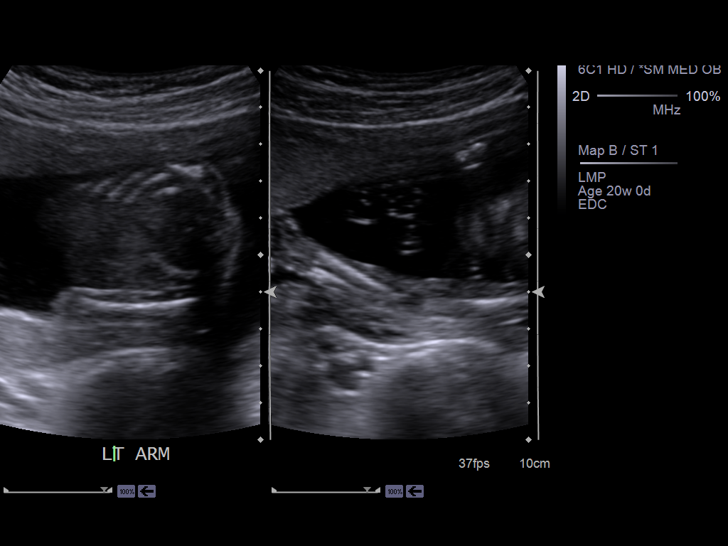
[im 56/101]
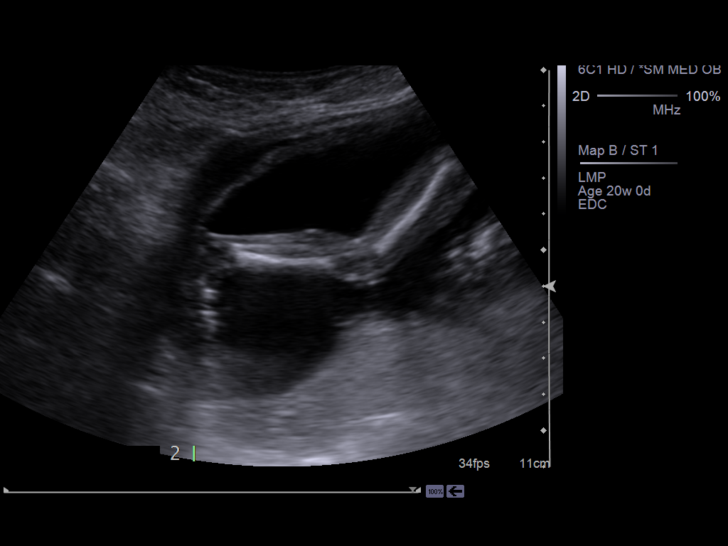
[im 63/101]
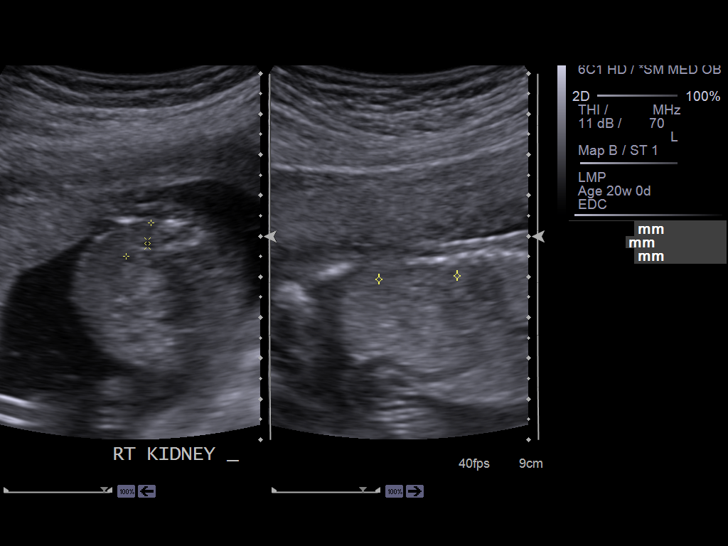
[im 71/101]
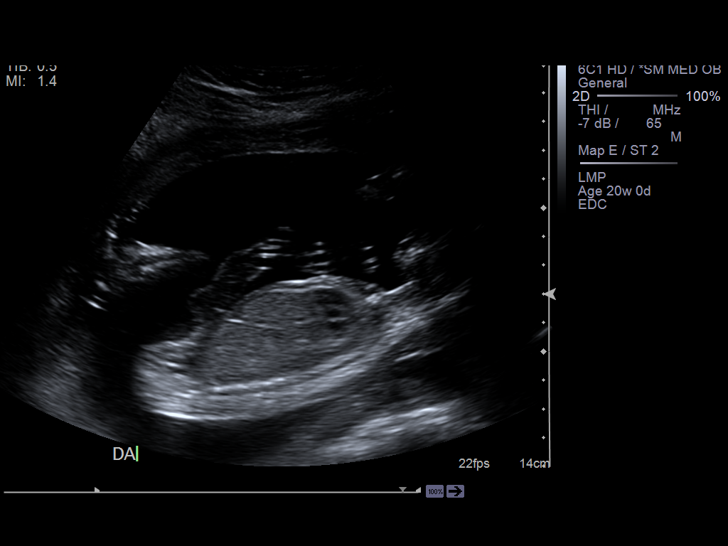
[im 78/101]
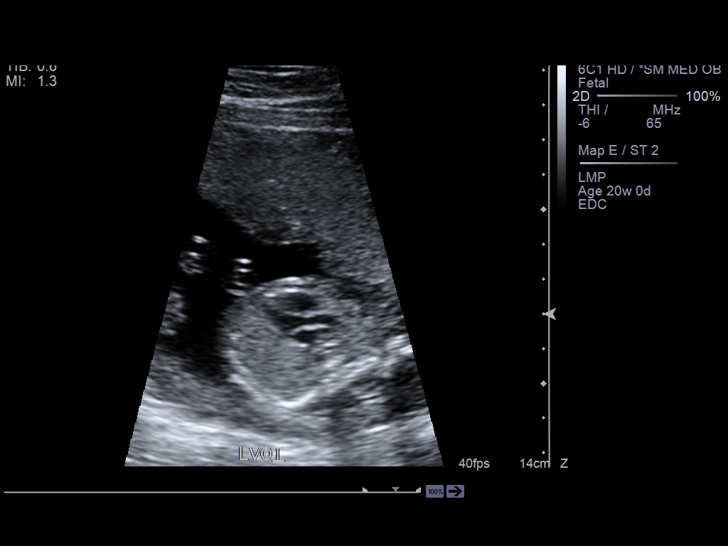
[im 86/101]
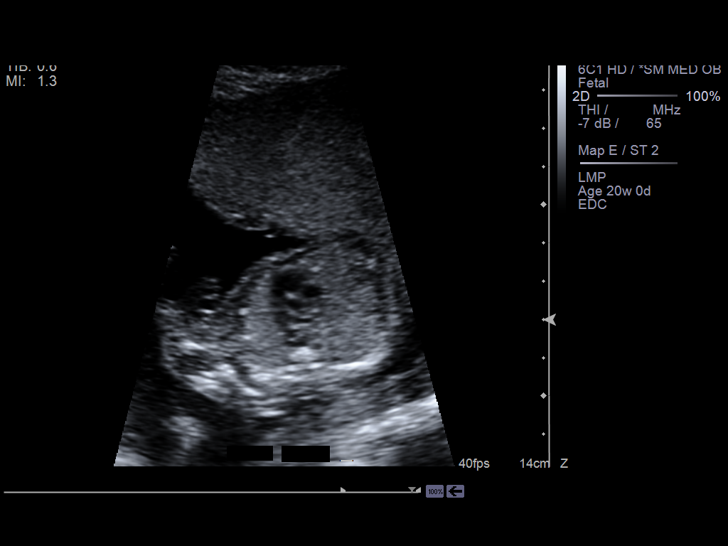
[im 93/101]
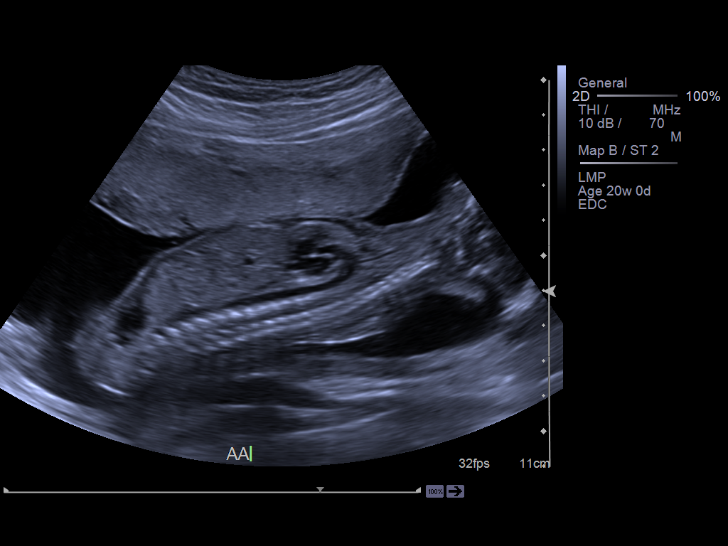
[im 101/101]
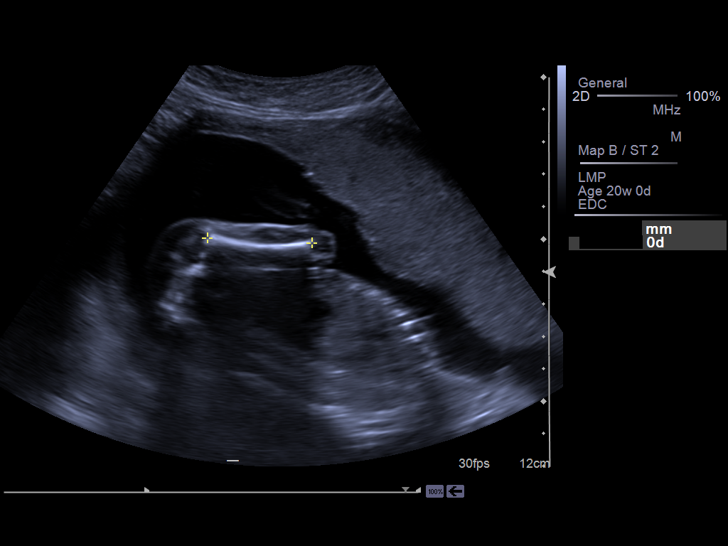

[14 of 28 positions shown; findings below may reference images not displayed]

IMAGES IMPORTED FROM THE [employer] WORKFLOW SYSTEM
NO DICTATION FOR STUDY

## 2014-06-02 NOTE — Consult Note (Signed)
Brief Consult Note: Diagnosis: Depressive disorder NOS, cocaine abuse, opioid abuse, MJ abuse..   Patient was seen by consultant.   Consult note dictated.   Recommend further assessment or treatment.   Orders entered.   Discussed with Attending MD.   Comments: Ms. Tenny CrawBlacknall is no longer suicidal, homicidal, psychotic or excessively anxious. She is able to contract for safety. She accepted medications and tolerates them well. She minimizes substance abuse problems and declines treatment. She spoke with her partner and they resolved their differences.  MSE: Alert, oriented, pleasant cooperative. Giooid mood, full affect. No SI,HI, delusions or paranoia, no AH, VH. Cognition is intact. Fair insight and judgment.  PLAN: 1. The patient no longer meets criteria for IVC. I will teminate proceeding . Please discharge as appropriate.  2. She will continue on Prozac 20 mg for depression and Trazodone 100 mg at night for insomnia. Rx given.  3. She will follow up with Dr. Suzie PortelaMoffitt, her rerular psychiatrist, tomorrow at [facility].  Electronic Signatures: Kristine LineaPucilowska, Maily Debarge (MD)  (Signed 22-Jan-13 11:11)  Authored: Brief Consult Note   Last Updated: 22-Jan-13 11:11 by Kristine LineaPucilowska, Jahziel Sinn (MD)

## 2014-06-02 NOTE — Consult Note (Signed)
Brief Consult Note: Diagnosis: Depressive disorder NOS, cocaine abuse, opioid abuse, MJ abuse..  Patient was seen by consultant.   Consult note dictated.   Recommend further assessment or treatment.   Orders entered.   Discussed with Attending MD.   Comments: Ms. Tenny CrawBlacknall has a h/o depression. She overdosed on Tylenol in front of her girlfriend as they were arguing.  PLAN: 1. The patient is IVC but no beds available in [facility]. Will try to admitt if beds available.   2. I will start Prozac. She has not been compliant [facility].  3. I will follow up.  Electronic Signatures: Kristine LineaPucilowska, Roberta Angell (MD)  (Signed 21-Jan-13 20:30)  Authored: Brief Consult Note   Last Updated: 21-Jan-13 20:30 by Kristine LineaPucilowska, Koran Seabrook (MD)
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
## 2014-06-18 NOTE — H&P (Signed)
L&D Evaluation:  History:  HPI 29 year old G1 P0 with EDC=07/27/2012 by a 8 5/7 week ultrasound presents with c/o left sided abdominal pain and back pain as well as decreased FM since falling down some icy stairs on her buttocks/back 5 days ago. Denies vaginal bleeding. Has noticed some tightening of her uterus, but no regular or painful ctxs. has taken Tylenol for left side pain without relief.. Felt like she had bruised her buttocks. Prenatal care at [facility] remarkable for a hx of polysubstance abuse (cocaine, ETOH, MJ) which she quit using in Sept 2013and  bipolar depression with hx of suicide attempt 02/2011 (was on Risperdal until 10/2011). LABS: A POS, Varicella nonimmune and Rubella equivocal.   Presents with abdominal pain, back pain, decreased fetal movement, s/p fall 5 days ago.   Patient's Medical History polysubstance abuse, bipolar disorder    Patient's Surgical History Tonsilectomy    Medications Pre Natal Vitamins    Allergies peanut   Social History tobacco  EtOH  drugs  quit in 10/2011    Family History Non-Contributory    ROS:  ROS see HPI   Exam:  Vital Signs 118/72    Urine Protein negative dipstick, UA negative   General no apparent distress   Mental Status clear    Abdomen tenderness over left round ligament area. One ctx palpated.   Fetal Position cephalic on ultrasound; anterior placenta appears WNL; baby active.   Back tenderness on leftlumbar sacral area   Edema no edema   Reflexes 1+   Pelvic no external lesions, cervix closed and thick, and baby ballotable   Mebranes Intact   FHT normal rate with no decels, Fhr 135 with accels to 150s -160   FHT Description mod variability   Fetal Heart Rate 140   Ucx absent   Impression:  Impression IUP at 27 5/7 weeks with reactive NST; musculoskeletal pain due to fall 5 days ago.   Plan:  Plan DC  home. Recommended heat or ice for  left round ligament pain, heat or Maxfreeze for back and maternity  support garment. FU this week for ROB at [facility].   Electronic Signatures: Trinna BalloonGutierrez, Nicasio Barlowe L (CNM)  (Signed 24-Mar-14 03:33)  Authored: L&D Evaluation   Last Updated: 24-Mar-14 03:33 by Trinna BalloonGutierrez, Darien Mignogna L (CNM)

## 2014-06-18 NOTE — H&P (Signed)
L&D Evaluation:  History:  HPI 29 year old G1 P0 at 513w1d by D=8wk US derived EDC=07/27/2012 presenting for postdates IOL. Irregular ctx, +FM, no LOF, no VB. Was treated with rocephin at [place] in [place] earlier this month.  Currently no dysuria.   Prenatal care at [facility] remarkable for a hx of polysubstance abuse (cocaine, ETOH, MJ) which she quit using in Sept 2013 and  bipolar depression with hx of suicide attempt 02/2011 (was on Risperdal until 10/2011). LABS: A POS, Varicella nonimmune and Rubella equivocal.   Presents with IOL   Patient's Medical History polysubstance abuse, bipolar disorder   Patient's Surgical History Tonsilectomy   Medications Pre Natal Vitamins   Allergies peanut   Social History tobacco  EtOH  drugs  quit in 10/2011   Family History Non-Contributory   ROS:  ROS see HPI   Exam:  Vital Signs stable   Urine Protein not completed   General no apparent distress   Mental Status clear   Chest no increased work of breathing   Abdomen gravid, non-tender   Fetal Position vtx   Back no CVAT   Edema no edema   Reflexes 1+   Pelvic no external lesions, 4/70/-1   Mebranes Intact   FHT normal rate with no decels, Category 1 tracing   Ucx irregular   Impression:  Impression IOL postdates   Plan:  Plan monitor contractions and for cervical change, antibiotics for GBBS prophylaxis   Comments - pitocin   Electronic Signatures: Lorrene ReidStaebler, Jessica Checketts M (MD)  (Signed 27-Jun-14 08:22)  Authored: L&D Evaluation   Last Updated: 27-Jun-14 08:22 by Lorrene ReidStaebler, Tallan Sandoz M (MD)

## 2014-06-18 NOTE — H&P (Signed)
L&D Evaluation:  History:  HPI 29 year old G1 P0 at 631w4d by D=8wk US derived EDC=07/27/2012 presenting with abdominal tightening/contractions and right sided flank/back pain. Ctx started at about 10 PM yesterday.  Unable to quantify interval.  +FM, no LOF, no VB. Was treated with rocephin at [place] in [place] earlier this month.  Currently no dysuria.   Prenatal care at [facility] remarkable for a hx of polysubstance abuse (cocaine, ETOH, MJ) which she quit using in Sept 2013 and  bipolar depression with hx of suicide attempt 02/2011 (was on Risperdal until 10/2011). LABS: A POS, Varicella nonimmune and Rubella equivocal.   Presents with contractions, back pain   Patient's Medical History polysubstance abuse, bipolar disorder   Patient's Surgical History Tonsilectomy   Medications Pre Natal Vitamins   Allergies peanut   Social History tobacco  EtOH  drugs  quit in 10/2011   Family History Non-Contributory   ROS:  ROS see HPI   Exam:  Vital Signs stable   Urine Protein not completed, UA pending   General no apparent distress   Mental Status clear   Chest no increased work of breathing   Abdomen gravid, non-tender   Fetal Position vtxc   Back no CVAT   Edema no edema   Reflexes 1+   Pelvic no external lesions, 2/70/-2   Mebranes Intact   FHT normal rate with no decels, Category 1 tracing   Ucx irregular   Impression:  Impression R/O labor   Plan:  Plan monitor contractions and for cervical change   Comments - UA - UDS   Follow Up Appointment already scheduled. [facility] on 07/27/12   Electronic Signatures for Addendum Section:  Lorrene ReidStaebler, Nnamdi Dacus M (MD) (Signed Addendum 16-Jun-14 11:12)  UA consistent with UTI rx of macrobid provided.  UCx sent  Routine labor precautions.  AFI of 12.62cm.  No contractions noted other than some irritability.  IOL scheduled for 6/27   Electronic Signatures: Lorrene ReidStaebler, Yusuke Beza M (MD)  (Signed 16-Jun-14 10:12)  Authored: L&D  Evaluation   Last Updated: 16-Jun-14 11:12 by Lorrene ReidStaebler, Damien Cisar M (MD)

## 2014-06-18 NOTE — H&P (Signed)
L&D Evaluation:  History Expanded:  HPI 29 year old G1 P0 with EDC=07/27/2012 by a 8 5/7 week ultrasound. Pt reports being seen in hospital in [place] last night and treated for UTI with Rocephin 1 gram IM (per records), now c/o vaginal irritation and swelling. Denies vaginal bleeding. +FM, no LOF, rare ctx. Prenatal care at [facility] remarkable for a hx of polysubstance abuse (cocaine, ETOH, MJ) which she quit using in Sept 2013 and  bipolar depression with hx of suicide attempt 02/2011 (was on Risperdal until 10/2011). LABS: A POS, Varicella nonimmune and Rubella equivocal.   Patient's Medical History polysubstance abuse, bipolar disorder   Patient's Surgical History Tonsilectomy   Medications Pre Natal Vitamins   Allergies peanut   Social History tobacco  EtOH  drugs  quit in 10/2011   Family History Non-Contributory   ROS:  ROS see HPI   Exam:  Vital Signs stable   Urine Protein negative dipstick   General no apparent distress   Mental Status clear   Abdomen gravid, non-tender   Reflexes 1+   Pelvic no external lesions, 1/50/-3   Mebranes Intact   FHT normal rate with no decels, Category 1 tracing   Ucx irregular   Other [place]: + yeast   Impression:  Impression Vulvovaginal Yeast   Plan:  Comments Diflucan 150 mg po x 1 before d/c Rx for repeat Diflucan in 1-2 days.   Follow Up Appointment already scheduled. [facility]   Electronic Signatures: Vella KohlerBrothers, Mickell Birdwell K (CNM)  (Signed 02-Jun-14 22:59)  Authored: L&D Evaluation   Last Updated: 02-Jun-14 22:59 by Vella KohlerBrothers, Lewellyn Fultz K (CNM)

## 2016-11-01 ENCOUNTER — Encounter: Payer: Self-pay | Admitting: Emergency Medicine

## 2016-11-01 ENCOUNTER — Emergency Department
Admission: EM | Admit: 2016-11-01 | Discharge: 2016-11-01 | Disposition: A | Discharge status: Home / Self Care | Payer: Medicaid Other | Attending: Emergency Medicine | Admitting: Emergency Medicine

## 2016-11-01 DIAGNOSIS — R197 Diarrhea, unspecified: Secondary | ICD-10-CM | POA: Diagnosis not present

## 2016-11-01 DIAGNOSIS — F17210 Nicotine dependence, cigarettes, uncomplicated: Secondary | ICD-10-CM | POA: Insufficient documentation

## 2016-11-01 DIAGNOSIS — R109 Unspecified abdominal pain: Secondary | ICD-10-CM | POA: Diagnosis present

## 2016-11-01 LAB — URINALYSIS, COMPLETE (UACMP) WITH MICROSCOPIC
BILIRUBIN URINE: NEGATIVE
Bacteria, UA: NONE SEEN
GLUCOSE, UA: NEGATIVE mg/dL
Ketones, ur: NEGATIVE mg/dL
LEUKOCYTES UA: NEGATIVE
NITRITE: NEGATIVE
PH: 5 (ref 5.0–8.0)
Protein, ur: 30 mg/dL — AB
SPECIFIC GRAVITY, URINE: 1.028 (ref 1.005–1.030)

## 2016-11-01 LAB — COMPREHENSIVE METABOLIC PANEL
ALBUMIN: 3.9 g/dL (ref 3.5–5.0)
ALK PHOS: 86 U/L (ref 38–126)
ALT: 15 U/L (ref 14–54)
ANION GAP: 10 (ref 5–15)
AST: 24 U/L (ref 15–41)
BUN: 9 mg/dL (ref 6–20)
CALCIUM: 8.9 mg/dL (ref 8.9–10.3)
CO2: 21 mmol/L — ABNORMAL LOW (ref 22–32)
Chloride: 107 mmol/L (ref 101–111)
Creatinine, Ser: 0.7 mg/dL (ref 0.44–1.00)
GFR calc Af Amer: >60 mL/min (ref >60)
GLUCOSE: 89 mg/dL (ref 65–99)
POTASSIUM: 3.5 mmol/L (ref 3.5–5.1)
Sodium: 138 mmol/L (ref 135–145)
Total Bilirubin: 0.8 mg/dL (ref 0.3–1.2)
Total Protein: 6.9 g/dL (ref 6.5–8.1)

## 2016-11-01 LAB — CBC
HEMATOCRIT: 38.6 % (ref 35.0–47.0)
Hemoglobin: 13.6 g/dL (ref 12.0–16.0)
MCH: 31.3 pg (ref 26.0–34.0)
MCHC: 35.3 g/dL (ref 32.0–36.0)
MCV: 88.7 fL (ref 80.0–100.0)
Platelets: 292 10*3/uL (ref 150–440)
RBC: 4.35 MIL/uL (ref 3.80–5.20)
RDW: 14.1 % (ref 11.5–14.5)
WBC: 6.6 10*3/uL (ref 3.6–11.0)

## 2016-11-01 LAB — LIPASE, BLOOD: Lipase: 28 U/L (ref 11–51)

## 2016-11-01 LAB — POCT PREGNANCY, URINE: Preg Test, Ur: NEGATIVE

## 2016-11-01 NOTE — ED Triage Notes (Signed)
Patient presents to ED via POV from home with c/o abdominal pain with diarrhea x 1 week.

## 2016-11-01 NOTE — ED Notes (Signed)
Patient reports diarrhea X1 week. Also states she has been bleeding X1 week. HX tubal ligation.

## 2016-11-01 NOTE — ED Provider Notes (Signed)
[facility] Provider [facility]  ____________________________________________   I have reviewed the triage vital signs and the nursing notes.   HISTORY  Chief Complaint Abdominal Pain    HPI [name] is a 31 y.o. female Who presents today complaining of having had diarrhea today. Also she is hungry and she was to go home and eat. She does not wish further workup but she wants to make sure her blood work is okay. She's had no melena or bright red blood per extremity no hematemesis no fever no chills no chest pain she denies ongoing abdominal pain she has had some mild abdominal cramping. Patient is on her menstrual period which is normal for her and has normal menstrual bleeding with no vaginal discharge and she does not feel lightheaded. Patient declines pelvic exam or further workup and she just wants to go home if possible. Ligation in the past.Nothing makes her diarrhea better or worse. She has had no recent antibiotics or recent travel   **   History reviewed. No pertinent past medical history.  There are no active problems to display for this patient.   Past Surgical History:  Procedure Laterality Date  . TONSILLECTOMY    . TUBAL LIGATION      Prior to Admission medications   Not on File    Allergies Patient has no known allergies.  No family history on file.  Social History Social History  Substance Use Topics  . Smoking status: Current Every Day Smoker    Types: Cigarettes  . Smokeless tobacco: Never Used  . Alcohol use No    Review of Systems Constitutional: No fever/chills Eyes: No visual changes. ENT: No sore throat. No stiff neck no neck pain Cardiovascular: Denies chest pain. Respiratory: Denies shortness of breath. Gastrointestinal:   no vomiting.  positivediarrhea.  No constipation. Genitourinary: Negative for dysuria. Musculoskeletal: Negative lower extremity swelling Skin: Negative  for rash. Neurological: Negative for severe headaches, focal weakness or numbness.   ____________________________________________   PHYSICAL EXAM:  VITAL SIGNS: ED Triage Vitals [11/01/16 1633]  Enc Vitals Group     BP 117/70     Pulse Rate 75     Resp 17     Temp 98.2 F (36.8 C)     Temp Source Oral     SpO2 99 %     Weight 220 lb (99.8 kg)     Height  (1.6 m)     Head Circumference      Peak Flow      Pain Score 7     Pain Loc      Pain Edu?      Excl. in GC?     Constitutional: Alert and oriented. Well appearing and in no acute distress. Eyes: Conjunctivae are normal Head: Atraumatic HEENT: No congestion/rhinnorhea. Mucous membranes are moist.  Oropharynx non-erythematous Neck:   Nontender with no meningismus, no masses, no stridor Cardiovascular: Normal rate, regular rhythm. Grossly normal heart sounds.  Good peripheral circulation. Respiratory: Normal respiratory effort.  No retractions. Lungs CTAB. Abdominal: Soft and nontender. No distention. No guarding no rebound Back:  There is no focal tenderness or step off.  there is no midline tenderness there are no lesions noted. there is no CVA tenderness Musculoskeletal: No lower extremity tenderness, no upper extremity tenderness. No joint effusions, no DVT signs strong distal pulses no edema Neurologic:  Normal speech and language. No gross focal neurologic deficits are appreciated.  Skin:  Skin is  warm, dry and intact. No rash noted. Psychiatric: Mood and affect are normal. Speech and behavior are normal.  ____________________________________________   LABS (all labs ordered are listed, but only abnormal results are displayed)  Labs Reviewed  COMPREHENSIVE METABOLIC PANEL - Abnormal; Notable for the following:       Result Value   CO2 21 (*)    All other components within normal limits  LIPASE, BLOOD  CBC  URINALYSIS, COMPLETE (UACMP) WITH MICROSCOPIC  POC URINE PREG, ED    Pertinent labs  results  that were available during my care of the patient were reviewed by me and considered in my medical decision making (see chart for details). ____________________________________________  EKG  I personally interpreted any EKGs ordered by me or triage  ____________________________________________  RADIOLOGY  Pertinent labs & imaging results that were available during my care of the patient were reviewed by me and considered in my medical decision making (see chart for details). If possible, patient and/or family made aware of any abnormal findings. ____________________________________________    PROCEDURES  Procedure(s) performed: None  Procedures  Critical Care performed: None  ____________________________________________   INITIAL IMPRESSION / ASSESSMENT AND PLAN / ED COURSE  Pertinent labs & imaging results that were available during my care of the patient were reviewed by me and considered in my medical decision making (see chart for details).  patient here with diarrhea for 1 day or last sexually about 12 hours, very well-appearing, blood work reassuring, she is also states that she is on her normal menstrual period. She does not wwant any further workup from me she would like to go eat dinner. Tolerating by mouth here. Return precautions and follow-up given and understood    ____________________________________________   FINAL CLINICAL IMPRESSION(S) / ED DIAGNOSES  Final diagnoses:  None      This chart was dictated using voice recognition software.  Despite best efforts to proofread,  errors can occur which can change meaning.      Jeanmarie Plant, MD 11/01/16 [id number]

## 2016-11-02 LAB — POCT PREGNANCY, URINE: PREG TEST UR: NEGATIVE
